# Patient Record
Sex: Male | Born: 1985 | Hispanic: No | Marital: Single | Smoking: Current some day smoker
Health system: Southern US, Community
[De-identification: ages and names within clinical notes are randomized; demographics above are authoritative.]

## PROBLEM LIST (undated history)

## (undated) DIAGNOSIS — F79 Unspecified intellectual disabilities: Secondary | ICD-10-CM

## (undated) DIAGNOSIS — K4090 Unilateral inguinal hernia, without obstruction or gangrene, not specified as recurrent: Secondary | ICD-10-CM

## (undated) SURGERY — REPAIR, HERNIA, INGUINAL, ADULT
Anesthesia: General | Laterality: Left

---

## 2009-01-01 ENCOUNTER — Emergency Department (HOSPITAL_COMMUNITY): Admission: EM | Admit: 2009-01-01 | Discharge: 2009-01-01 | Payer: Self-pay | Admitting: Emergency Medicine

## 2012-03-22 ENCOUNTER — Emergency Department (HOSPITAL_COMMUNITY)
Admission: EM | Admit: 2012-03-22 | Discharge: 2012-03-22 | Disposition: A | Discharge status: Home / Self Care | Payer: Medicaid Other | Attending: Emergency Medicine | Admitting: Emergency Medicine

## 2012-03-22 ENCOUNTER — Encounter (HOSPITAL_COMMUNITY): Payer: Self-pay | Admitting: *Deleted

## 2012-03-22 DIAGNOSIS — R079 Chest pain, unspecified: Secondary | ICD-10-CM | POA: Insufficient documentation

## 2012-03-22 DIAGNOSIS — R000 Tachycardia, unspecified: Secondary | ICD-10-CM | POA: Insufficient documentation

## 2012-03-22 DIAGNOSIS — R0609 Other forms of dyspnea: Secondary | ICD-10-CM | POA: Insufficient documentation

## 2012-03-22 DIAGNOSIS — F17200 Nicotine dependence, unspecified, uncomplicated: Secondary | ICD-10-CM | POA: Insufficient documentation

## 2012-03-22 DIAGNOSIS — R0989 Other specified symptoms and signs involving the circulatory and respiratory systems: Secondary | ICD-10-CM | POA: Insufficient documentation

## 2012-03-22 DIAGNOSIS — R110 Nausea: Secondary | ICD-10-CM | POA: Insufficient documentation

## 2012-03-22 DIAGNOSIS — M549 Dorsalgia, unspecified: Secondary | ICD-10-CM | POA: Insufficient documentation

## 2012-03-22 DIAGNOSIS — R109 Unspecified abdominal pain: Secondary | ICD-10-CM | POA: Insufficient documentation

## 2012-03-22 LAB — CBC
HCT: 48.1 % (ref 39.0–52.0)
Hemoglobin: 16.9 g/dL (ref 13.0–17.0)
MCH: 31.2 pg (ref 26.0–34.0)
MCHC: 35.1 g/dL (ref 30.0–36.0)

## 2012-03-22 LAB — COMPREHENSIVE METABOLIC PANEL
Albumin: 5.5 g/dL — ABNORMAL HIGH (ref 3.5–5.2)
BUN: 15 mg/dL (ref 6–23)
Chloride: 100 mEq/L (ref 96–112)
Creatinine, Ser: 1.12 mg/dL (ref 0.50–1.35)
GFR calc Af Amer: >90 mL/min (ref >90)
Glucose, Bld: 89 mg/dL (ref 70–99)
Total Bilirubin: 0.8 mg/dL (ref 0.3–1.2)
Total Protein: 8.5 g/dL — ABNORMAL HIGH (ref 6.0–8.3)

## 2012-03-22 LAB — DIFFERENTIAL
Basophils Relative: 0 % (ref 0–1)
Eosinophils Absolute: 0 10*3/uL (ref 0.0–0.7)
Monocytes Absolute: 0.8 10*3/uL (ref 0.1–1.0)
Monocytes Relative: 8 % (ref 3–12)

## 2012-03-22 LAB — TROPONIN I: Troponin I: <0.3 ng/mL (ref <0.30)

## 2012-03-22 LAB — LIPASE, BLOOD: Lipase: 19 U/L (ref 11–59)

## 2012-03-22 MED ORDER — GI COCKTAIL ~~LOC~~
30.0000 mL | Freq: Once | ORAL | Status: AC
  Administered 2012-03-22: 30 mL via ORAL
  Filled 2012-03-22: qty 30

## 2012-03-22 MED ORDER — FAMOTIDINE IN NACL 20-0.9 MG/50ML-% IV SOLN
20.0000 mg | Freq: Once | INTRAVENOUS | Status: AC
  Administered 2012-03-22: 20 mg via INTRAVENOUS
  Filled 2012-03-22: qty 50

## 2012-03-22 MED ORDER — ONDANSETRON HCL 4 MG/2ML IJ SOLN: 4.0000 mg | Freq: Once | INTRAMUSCULAR | Status: DC

## 2012-03-22 MED ORDER — ONDANSETRON HCL 4 MG/2ML IJ SOLN
4.0000 mg | Freq: Once | INTRAMUSCULAR | Status: AC
  Administered 2012-03-22: 4 mg via INTRAVENOUS
  Filled 2012-03-22: qty 2

## 2012-03-22 MED ORDER — OMEPRAZOLE 20 MG PO CPDR: 20.0000 mg | DELAYED_RELEASE_CAPSULE | Freq: Every day | ORAL | Status: DC

## 2012-03-22 MED ORDER — SUCRALFATE 1 GM/10ML PO SUSP: 1.0000 g | Freq: Four times a day (QID) | ORAL | Status: DC | PRN

## 2012-03-22 MED ORDER — OXYCODONE-ACETAMINOPHEN 5-325 MG PO TABS
1.0000 | ORAL_TABLET | Freq: Once | ORAL | Status: AC
  Administered 2012-03-22: 1 via ORAL
  Filled 2012-03-22: qty 1

## 2012-03-22 MED ORDER — HYDROMORPHONE HCL PF 1 MG/ML IJ SOLN: 0.5000 mg | Freq: Once | INTRAMUSCULAR | Status: DC

## 2012-03-22 MED ORDER — SODIUM CHLORIDE 0.9 % IV BOLUS (SEPSIS)
1000.0000 mL | Freq: Once | INTRAVENOUS | Status: AC
  Administered 2012-03-22: 1000 mL via INTRAVENOUS

## 2012-03-22 NOTE — Discharge Instructions (Signed)

## 2012-03-22 NOTE — ED Notes (Signed)
Pt states "it started 2 days ago, it hurts in my chest and back, feels like it is from front through to the back"

## 2012-03-22 NOTE — ED Provider Notes (Signed)
History     CSN: [id number]  Arrival date & time 03/22/12  1624   First MD Initiated Contact with Patient 03/22/12 1716      Chief Complaint  Patient presents with  . Back Pain  . Chest Pain    (Consider location/radiation/quality/duration/timing/severity/associated sxs/prior treatment) HPI The patient presents with concerns of chest pain.  He notes that prior to 2 days ago he was in his usual state of health.  While working, he developed sternal discomfort, described as sharp and burning with radiation from front to the back.  He also complains of dyspnea.  She denies any fevers, chills.  He does endorse nausea, but denies diarrhea or vomiting.  No exertional pain, but there is mild pleuritic pain.  No relief with OTC medication.  Or syncope, no headache, no confusion, no lower extremity edema. The patient is a smoker. History reviewed. No pertinent past medical history.  History reviewed. No pertinent past surgical history.  No family history on file.  History  Substance Use Topics  . Smoking status: Current Everyday Smoker -- 0.5 packs/day  . Smokeless tobacco: Not on file  . Alcohol Use: No      Review of Systems  Constitutional:       Per HPI, otherwise negative  HENT:       Per HPI, otherwise negative  Eyes: Negative.   Respiratory:       Per HPI, otherwise negative  Cardiovascular:       Per HPI, otherwise negative  Gastrointestinal: Negative for vomiting.  Genitourinary: Negative.   Musculoskeletal:       Per HPI, otherwise negative  Skin: Negative.   Neurological: Negative for syncope.    Allergies  Review of patient's allergies indicates no known allergies.  Home Medications  No current outpatient prescriptions on file.  Wt 180 lb (81.647 kg)  SpO2 100%  Physical Exam  Nursing note and vitals reviewed. Constitutional: He is oriented to person, place, and time. He appears well-developed. No distress.  HENT:  Head: Normocephalic and atraumatic.   Eyes: Conjunctivae and EOM are normal.  Cardiovascular: Regular rhythm.  Tachycardia present.   Pulmonary/Chest: Effort normal. No stridor. No respiratory distress.  Abdominal: He exhibits no distension.  Musculoskeletal: He exhibits no edema.  Neurological: He is alert and oriented to person, place, and time.  Skin: Skin is warm and dry.  Psychiatric: He has a normal mood and affect.    ED Course  Procedures (including critical care time)   Labs Reviewed  CBC  DIFFERENTIAL  COMPREHENSIVE METABOLIC PANEL  LIPASE, BLOOD  TROPONIN I   No results found.   No diagnosis found.  Cardiac monitor 101 sinus tachycardia abnormal Pulse oximetry 100% repair normal   Date: 03/22/2012  Rate: 96  Rhythm: normal sinus rhythm  QRS Axis: normal  Intervals: normal  ST/T Wave abnormalities: nonspecific ST/T changes  Conduction Disutrbances:none  Narrative Interpretation:   Old EKG Reviewed: none available BORDERLINE (LIKELY AGE APPROPRIATE CHANGES)   MDM   This 26 year old male presents with new chest pain and dyspnea.  Patient denies any family history of cardiac risk factors, but is a smoker.  Given the patient's initial prescription with pleuritic pain and dyspnea, there is some suspicion of PE, although this is unlikely given his absence of hypoxia or tachypnea.  The patient's d-dimer was negative.  Remainder the patient's labs were unremarkable, and he felt better following ED interventions.  Absent syncope or lightheadedness, and with no cardiomegaly x-ray, there is  low suspicion for hypertrophic changes.  The patient's presentation is most consistent with GERD and the patient was discharged with omeprazole and Carafate to follow up with one of our associated clinics.  Return precautions were provided.      Gerhard Munch, MD 03/22/12 1942

## 2017-02-11 ENCOUNTER — Emergency Department (HOSPITAL_COMMUNITY)
Admission: EM | Admit: 2017-02-11 | Discharge: 2017-02-11 | Disposition: A | Discharge status: Home / Self Care | Payer: Medicaid Other | Attending: Emergency Medicine | Admitting: Emergency Medicine

## 2017-02-11 ENCOUNTER — Encounter (HOSPITAL_COMMUNITY): Payer: Self-pay

## 2017-02-11 DIAGNOSIS — M6282 Rhabdomyolysis: Secondary | ICD-10-CM | POA: Diagnosis not present

## 2017-02-11 DIAGNOSIS — R52 Pain, unspecified: Secondary | ICD-10-CM | POA: Diagnosis not present

## 2017-02-11 DIAGNOSIS — F17200 Nicotine dependence, unspecified, uncomplicated: Secondary | ICD-10-CM | POA: Diagnosis not present

## 2017-02-11 DIAGNOSIS — Z79899 Other long term (current) drug therapy: Secondary | ICD-10-CM | POA: Insufficient documentation

## 2017-02-11 DIAGNOSIS — R531 Weakness: Secondary | ICD-10-CM | POA: Diagnosis present

## 2017-02-11 HISTORY — DX: Unspecified intellectual disabilities: F79

## 2017-02-11 LAB — COMPREHENSIVE METABOLIC PANEL
ALK PHOS: 55 U/L (ref 38–126)
ALT: 28 U/L (ref 17–63)
AST: 74 U/L — ABNORMAL HIGH (ref 15–41)
Albumin: 4.6 g/dL (ref 3.5–5.0)
Anion gap: 17 — ABNORMAL HIGH (ref 5–15)
BUN: 20 mg/dL (ref 6–20)
CO2: 19 mmol/L — ABNORMAL LOW (ref 22–32)
Calcium: 9.9 mg/dL (ref 8.9–10.3)
Chloride: 101 mmol/L (ref 101–111)
Creatinine, Ser: 1.23 mg/dL (ref 0.61–1.24)
GFR calc non Af Amer: >60 mL/min (ref >60)
GLUCOSE: 86 mg/dL (ref 65–99)
Potassium: 4.6 mmol/L (ref 3.5–5.1)
SODIUM: 137 mmol/L (ref 135–145)
TOTAL PROTEIN: 7.3 g/dL (ref 6.5–8.1)
Total Bilirubin: 2 mg/dL — ABNORMAL HIGH (ref 0.3–1.2)

## 2017-02-11 LAB — CBC WITH DIFFERENTIAL/PLATELET
Basophils Absolute: 0 10*3/uL (ref 0.0–0.1)
Basophils Relative: 0 %
EOS ABS: 0 10*3/uL (ref 0.0–0.7)
EOS PCT: 0 %
HCT: 44.5 % (ref 39.0–52.0)
HEMOGLOBIN: 15.2 g/dL (ref 13.0–17.0)
LYMPHS ABS: 2.2 10*3/uL (ref 0.7–4.0)
Lymphocytes Relative: 14 %
MCH: 29.6 pg (ref 26.0–34.0)
MCHC: 34.2 g/dL (ref 30.0–36.0)
MCV: 86.7 fL (ref 78.0–100.0)
MONOS PCT: 8 %
Monocytes Absolute: 1.3 10*3/uL — ABNORMAL HIGH (ref 0.1–1.0)
NEUTROS PCT: 78 %
Neutro Abs: 12.1 10*3/uL — ABNORMAL HIGH (ref 1.7–7.7)
Platelets: 348 10*3/uL (ref 150–400)
RBC: 5.13 MIL/uL (ref 4.22–5.81)
RDW: 13.8 % (ref 11.5–15.5)
WBC: 15.6 10*3/uL — ABNORMAL HIGH (ref 4.0–10.5)

## 2017-02-11 LAB — CK
CK TOTAL: 2205 U/L — AB (ref 49–397)
Total CK: 2484 U/L — ABNORMAL HIGH (ref 49–397)

## 2017-02-11 LAB — URINALYSIS, ROUTINE W REFLEX MICROSCOPIC
BACTERIA UA: NONE SEEN
Bilirubin Urine: NEGATIVE
Glucose, UA: NEGATIVE mg/dL
Ketones, ur: 80 mg/dL — AB
Leukocytes, UA: NEGATIVE
Nitrite: NEGATIVE
Protein, ur: 30 mg/dL — AB
SPECIFIC GRAVITY, URINE: 1.023 (ref 1.005–1.030)
SQUAMOUS EPITHELIAL / LPF: NONE SEEN
pH: 5 (ref 5.0–8.0)

## 2017-02-11 MED ORDER — SODIUM CHLORIDE 0.9 % IV BOLUS (SEPSIS)
1000.0000 mL | Freq: Once | INTRAVENOUS | Status: AC
  Administered 2017-02-11: 1000 mL via INTRAVENOUS
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
## 2017-02-11 NOTE — Discharge Instructions (Signed)
Stay well-hydrated and have your kidney function and CK muscle enzyme rechecked early this week.  If you were given medicines take as directed.  If you are on coumadin or contraceptives realize their levels and effectiveness is altered by many different medicines.  If you have any reaction (rash, tongues swelling, other) to the medicines stop taking and see a physician.    If your blood pressure was elevated in the ER make sure you follow up for management with a primary doctor or return for chest pain, shortness of breath or stroke symptoms.  Please follow up as directed and return to the ER or see a physician for new or worsening symptoms.  Thank you. Vitals:   02/11/17 1800 02/11/17 1830 02/11/17 1900 02/11/17 1930  BP: (!) 134/93 137/85 (!) 129/94 (!) 142/80  Pulse: (!) 107 (!) 107 (!) 106 (!) 103  Resp:      Temp:      TempSrc:      SpO2: 99% 100% 99% 98%  Weight:      Height:
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
## 2017-02-11 NOTE — ED Notes (Signed)
Gave pt sandwich  

## 2017-04-18 ENCOUNTER — Encounter (HOSPITAL_COMMUNITY): Payer: Self-pay

## 2017-04-18 ENCOUNTER — Emergency Department (HOSPITAL_COMMUNITY)
Admission: EM | Admit: 2017-04-18 | Discharge: 2017-04-18 | Disposition: A | Discharge status: Home / Self Care | Payer: Medicaid Other | Attending: Emergency Medicine | Admitting: Emergency Medicine

## 2017-04-18 DIAGNOSIS — F17200 Nicotine dependence, unspecified, uncomplicated: Secondary | ICD-10-CM | POA: Diagnosis not present

## 2017-04-18 DIAGNOSIS — R1032 Left lower quadrant pain: Secondary | ICD-10-CM | POA: Diagnosis present

## 2017-04-18 DIAGNOSIS — K4091 Unilateral inguinal hernia, without obstruction or gangrene, recurrent: Secondary | ICD-10-CM | POA: Diagnosis not present

## 2017-04-18 DIAGNOSIS — Z79899 Other long term (current) drug therapy: Secondary | ICD-10-CM | POA: Diagnosis not present

## 2017-04-18 MED ORDER — IBUPROFEN 600 MG PO TABS: 600.0000 mg | ORAL_TABLET | Freq: Four times a day (QID) | ORAL | 0 refills | Status: DC | PRN

## 2017-04-18 MED ORDER — MORPHINE SULFATE (PF) 2 MG/ML IV SOLN
4.0000 mg | Freq: Once | INTRAVENOUS | Status: AC
  Administered 2017-04-18: 4 mg via INTRAVENOUS
  Filled 2017-04-18: qty 2

## 2017-04-18 NOTE — ED Triage Notes (Signed)
Pt states bulge in groin.   Inguinal groove.  Pt states started x 2 days ago.  Denies lifting anything.  Feels it is a "hernia".

## 2017-04-18 NOTE — ED Provider Notes (Signed)
[facility]-EMERGENCY DEPT Provider Note   CSN: [id number] Arrival date & time: 04/18/17  1407     History   Chief Complaint Chief Complaint  Patient presents with  . Groin Pain    HPI Jack Mathews is a 31 y.o. male.  HPI  [age] y.o. male with a hx of Mental Disability, presents to the Emergency Department today due to groin pain x 2 days ago. Noted in left inguinal groove. Denies lifting anything. Pt states that it feels like a hernia. Denies N/V. No discoloration. Notes pain 6/10. Throbbing sensation. Does state that the bulge increases when standing and decreases with lying flat. No abdominal surgery history. No meds PTA. No fevers. No CP/SOB. Normal BMs. No other symptoms noted.   Past Medical History:  Diagnosis Date  . 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   History reviewed. No pertinent surgical history.     Home Medications    Prior to Admission medications   Medication Sig Start Date End Date Taking? Authorizing Provider  ibuprofen (ADVIL,MOTRIN) 200 MG tablet Take 200-400 mg by mouth every 6 (six) hours as needed (for pain or headaches).    [provider]  loratadine (CLARITIN) 10 MG tablet Take 10 mg by mouth daily.    [provider]  omeprazole (PRILOSEC) 20 MG capsule Take 1 capsule (20 mg total) by mouth daily. Patient not taking: Reported on 02/11/2017 03/22/12 02/11/17  Gerhard MunchLockwood, Robert, MD  sucralfate (CARAFATE) 1 GM/10ML suspension Take 10 mLs (1 g total) by mouth every 6 (six) hours as needed (abdominal pain). Patient not taking: Reported on 02/11/2017 03/22/12 02/11/17  Gerhard MunchLockwood, Robert, MD    Family History History reviewed. No pertinent family history.  Social History Social History  Substance Use Topics  . Smoking status: Current Every Day Smoker    Packs/day: 0.50  . Smokeless tobacco: Never Used  . Alcohol use No     Allergies   Patient has no  known allergies.   Review of Systems Review of Systems ROS reviewed and all are negative for acute change except as noted in the HPI.  Physical Exam Updated Vital Signs BP 127/81 (BP Location: Left Arm)   Pulse 88   Temp 98.4 F (36.9 C) (Oral)   Resp 18   SpO2 98%   Physical Exam  Constitutional: He is oriented to person, place, and time. Vital signs are normal. He appears well-developed and well-nourished.  HENT:  Head: Normocephalic and atraumatic.  Right Ear: Hearing normal.  Left Ear: Hearing normal.  Eyes: Conjunctivae and EOM are normal. Pupils are equal, round, and reactive to light.  Neck: Normal range of motion. Neck supple.  Cardiovascular: Normal rate, regular rhythm, normal heart sounds and intact distal pulses.   Pulmonary/Chest: Effort normal and breath sounds normal.  Abdominal: Soft. A hernia is present.  Left sided hernia noted along inguinal canal. No discoloration. Reducible.   Musculoskeletal: Normal range of motion.  Neurological: He is alert and oriented to person, place, and time.  Skin: Skin is warm and dry.  Psychiatric: He has a normal mood and affect. His speech is normal and behavior is normal. Thought content normal.  Nursing note and vitals reviewed.  ED Treatments / Results  Labs (all labs ordered are listed, but only abnormal results are displayed) Labs Reviewed - No data to display  EKG  EKG Interpretation None  Radiology No results found.  Procedures Hernia reduction Date/Time: 04/18/2017 4:32 PM Performed by: Audry Pili Authorized by: Audry Pili  Consent: Verbal consent obtained. Consent given by: patient Patient understanding: patient states understanding of the procedure being performed Patient identity confirmed: verbally with patient and arm band Patient tolerance: Patient tolerated the procedure well with no immediate complications Comments: Successful reduction of direct inguinal hernia     (including  critical care time)  Medications Ordered in ED Medications - No data to display   Initial Impression / Assessment and Plan / ED Course  I have reviewed the triage vital signs and the nursing notes.  Pertinent labs & imaging results that were available during my care of the patient were reviewed by me and considered in my medical decision making (see chart for details).  Final Clinical Impressions(s) / ED Diagnoses     {I have reviewed the relevant previous healthcare records.  {I obtained HPI from historian. {Patient discussed with supervising physician.  ED Course:  Assessment: Pt is a 31 y.o. male with hx Mental Disability who presents with left sided inguinal pain x 2 days. No N/V. No discoloration. No abdominal surgeries. On exam, pt in NAD. Nontoxic/nonseptic appearing. VSS. Afebrile. Lungs CTA. Heart RRR. Abdomen nontender soft. Noted reducible hernia on left inguina canal. Reduced in ED. Pain free after reduction. Given analgesia in ED. Plan is to DC Home with follow up to General Surgery. At time of discharge, Patient is in no acute distress. Vital Signs are stable. Patient is able to ambulate. Patient able to tolerate PO.   Disposition/Plan:  DC Home Additional Verbal discharge instructions given and discussed with patient.  Pt Instructed to f/u with General Surgery in the next week for evaluation and treatment of symptoms. Return precautions given Pt acknowledges and agrees with plan  Supervising Physician Tegeler, Canary Brim, *  Final diagnoses:  Unilateral recurrent inguinal hernia without obstruction or gangrene    New Prescriptions New Prescriptions   No medications on file     Audry Pili, PA-C 04/18/17 1700    Tegeler, Canary Brim, MD 04/19/17 [id number]
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
## 2017-08-07 ENCOUNTER — Ambulatory Visit: Payer: Self-pay | Admitting: Surgery

## 2017-08-07 DIAGNOSIS — K4090 Unilateral inguinal hernia, without obstruction or gangrene, not specified as recurrent: Secondary | ICD-10-CM

## 2017-08-08 NOTE — Patient Instructions (Addendum)
Jack Mathews  08/09/2017   Your procedure is scheduled on: 08-13-17   Report [facility] Main  Entrance [place]  elevators to 3rd floor to  Short Stay Center at 5:30 AM.   Call this number if you have problems the morning of surgery [phone]    Remember: ONLY 1 PERSON MAY GO WITH YOU TO SHORT STAY TO GET  READY MORNING OF YOUR SURGERY.  Do not eat food or drink liquids :After Midnight.     Take these medicines the morning of surgery with A SIP OF WATER: None                                You may not have any metal on your body including hair pins and              piercings  Do not wear jewelry, lotions, powders,  deodorant             Men may shave face and neck.   Do not bring valuables to the hospital. [facility] IS NOT             RESPONSIBLE   FOR VALUABLES.  Contacts, dentures or bridgework may not be worn into surgery. .    Patients discharged the day of surgery will not be allowed to drive home.  Name and phone number of your driver: Jack Mathews [phone]                    Please read over the following fact sheets you were given: _____________________________________________________________________             [facility] - Preparing for [facility] Before surgery, you can play an important role.  Because skin is not sterile, your skin needs to be as free of germs as possible.  You can reduce the number of germs on your skin by washing with CHG (chlorahexidine gluconate) soap before surgery.  CHG is an antiseptic cleaner which kills germs and bonds with the skin to continue killing germs even after washing. Please DO NOT use if you have an allergy to CHG or antibacterial soaps.  If your skin becomes reddened/irritated stop using the CHG and inform your Jack Mathews when you arrive at Short Stay. Do not shave (including legs and underarms) for at least 48 hours prior to the first CHG shower.  You may shave your face/neck. Please follow these  instructions carefully:  1.  Shower with CHG Soap the night before surgery and the  morning of Surgery.  2.  If you choose to wash your hair, wash your hair first as usual with your  normal  shampoo.  3.  After you shampoo, rinse your hair and body thoroughly to remove the  shampoo.                           4.  Use CHG as you would any other liquid soap.  You can apply chg directly  to the skin and wash                       Gently with a scrungie or clean washcloth.  5.  Apply the CHG Soap to your body ONLY FROM THE NECK DOWN.   Do not use  on face/ open                           Wound or open sores. Avoid contact with eyes, ears mouth and genitals (private parts).                       Wash face,  Genitals (private parts) with your normal soap.             6.  Wash thoroughly, paying special attention to the area where your surgery  will be performed.  7.  Thoroughly rinse your body with warm water from the neck down.  8.  DO NOT shower/wash with your normal soap after using and rinsing off  the CHG Soap.                9.  Pat yourself dry with a clean towel.            10.  Wear clean pajamas.            11.  Place clean sheets on your bed the night of your first shower and do not  sleep with pets. Day of Surgery : Do not apply any lotions/deodorants the morning of surgery.  Please wear clean clothes to the hospital/surgery center.  FAILURE TO FOLLOW THESE INSTRUCTIONS MAY RESULT IN THE CANCELLATION OF YOUR SURGERY PATIENT SIGNATURE_________________________________  Jack Mathews SIGNATURE__________________________________  ________________________________________________________________________

## 2017-08-09 ENCOUNTER — Encounter (HOSPITAL_COMMUNITY)
Admission: RE | Admit: 2017-08-09 | Discharge: 2017-08-09 | Disposition: A | Discharge status: Home / Self Care | Payer: Medicaid Other | Source: Ambulatory Visit | Attending: Surgery | Admitting: Surgery

## 2017-08-09 ENCOUNTER — Encounter (INDEPENDENT_AMBULATORY_CARE_PROVIDER_SITE_OTHER): Payer: Self-pay

## 2017-08-09 ENCOUNTER — Encounter (HOSPITAL_COMMUNITY): Payer: Self-pay

## 2017-08-09 DIAGNOSIS — Z01810 Encounter for preprocedural cardiovascular examination: Secondary | ICD-10-CM | POA: Insufficient documentation

## 2017-08-09 DIAGNOSIS — K4090 Unilateral inguinal hernia, without obstruction or gangrene, not specified as recurrent: Secondary | ICD-10-CM | POA: Insufficient documentation

## 2017-08-09 DIAGNOSIS — Z01818 Encounter for other preprocedural examination: Secondary | ICD-10-CM | POA: Insufficient documentation

## 2017-08-09 LAB — URINALYSIS, ROUTINE W REFLEX MICROSCOPIC
BACTERIA UA: NONE SEEN
Bilirubin Urine: NEGATIVE
GLUCOSE, UA: NEGATIVE mg/dL
Ketones, ur: 80 mg/dL — AB
LEUKOCYTES UA: NEGATIVE
NITRITE: NEGATIVE
PH: 5 (ref 5.0–8.0)
Protein, ur: NEGATIVE mg/dL
Specific Gravity, Urine: 1.021 (ref 1.005–1.030)

## 2017-08-09 LAB — COMPREHENSIVE METABOLIC PANEL
ALT: 15 U/L — ABNORMAL LOW (ref 17–63)
ANION GAP: 12 (ref 5–15)
AST: 20 U/L (ref 15–41)
Albumin: 5.2 g/dL — ABNORMAL HIGH (ref 3.5–5.0)
Alkaline Phosphatase: 56 U/L (ref 38–126)
BUN: 11 mg/dL (ref 6–20)
CHLORIDE: 103 mmol/L (ref 101–111)
CO2: 25 mmol/L (ref 22–32)
Calcium: 10.2 mg/dL (ref 8.9–10.3)
Creatinine, Ser: 1.06 mg/dL (ref 0.61–1.24)
Glucose, Bld: 71 mg/dL (ref 65–99)
Potassium: 3.7 mmol/L (ref 3.5–5.1)
SODIUM: 140 mmol/L (ref 135–145)
Total Bilirubin: 1.2 mg/dL (ref 0.3–1.2)
Total Protein: 8.2 g/dL — ABNORMAL HIGH (ref 6.5–8.1)

## 2017-08-09 LAB — CBC WITH DIFFERENTIAL/PLATELET
Basophils Absolute: 0 10*3/uL (ref 0.0–0.1)
Basophils Relative: 0 %
EOS ABS: 0.1 10*3/uL (ref 0.0–0.7)
EOS PCT: 1 %
HCT: 44.8 % (ref 39.0–52.0)
Hemoglobin: 15.8 g/dL (ref 13.0–17.0)
LYMPHS ABS: 1.5 10*3/uL (ref 0.7–4.0)
LYMPHS PCT: 15 %
MCH: 30.7 pg (ref 26.0–34.0)
MCHC: 35.3 g/dL (ref 30.0–36.0)
MCV: 87 fL (ref 78.0–100.0)
Monocytes Absolute: 0.6 10*3/uL (ref 0.1–1.0)
Monocytes Relative: 6 %
NEUTROS ABS: 8.1 10*3/uL — AB (ref 1.7–7.7)
NEUTROS PCT: 78 %
PLATELETS: 313 10*3/uL (ref 150–400)
RBC: 5.15 MIL/uL (ref 4.22–5.81)
RDW: 12.8 % (ref 11.5–15.5)
WBC: 10.3 10*3/uL (ref 4.0–10.5)

## 2017-08-09 NOTE — Progress Notes (Signed)
08-09-17 UA results routed to Dr. Cliffton AstersWhite for review.

## 2017-08-13 ENCOUNTER — Ambulatory Visit (HOSPITAL_COMMUNITY)
Admission: RE | Admit: 2017-08-13 | Discharge: 2017-08-13 | Disposition: A | Discharge status: Home / Self Care | Payer: Medicaid Other | Source: Ambulatory Visit | Attending: Surgery | Admitting: Surgery

## 2017-08-13 ENCOUNTER — Encounter (HOSPITAL_COMMUNITY)
Admission: RE | Disposition: A | Discharge status: Home / Self Care | Payer: Self-pay | Source: Ambulatory Visit | Attending: Surgery

## 2017-08-13 ENCOUNTER — Encounter (HOSPITAL_COMMUNITY): Payer: Self-pay | Admitting: *Deleted

## 2017-08-13 ENCOUNTER — Ambulatory Visit (HOSPITAL_COMMUNITY): Payer: Medicaid Other | Admitting: Certified Registered"

## 2017-08-13 DIAGNOSIS — K4090 Unilateral inguinal hernia, without obstruction or gangrene, not specified as recurrent: Secondary | ICD-10-CM | POA: Insufficient documentation

## 2017-08-13 DIAGNOSIS — F17210 Nicotine dependence, cigarettes, uncomplicated: Secondary | ICD-10-CM | POA: Diagnosis not present

## 2017-08-13 DIAGNOSIS — Z9102 Food additives allergy status: Secondary | ICD-10-CM | POA: Diagnosis not present

## 2017-08-13 DIAGNOSIS — F78 Other intellectual disabilities: Secondary | ICD-10-CM | POA: Insufficient documentation

## 2017-08-13 HISTORY — PX: INSERTION OF MESH: SHX5868

## 2017-08-13 HISTORY — PX: INGUINAL HERNIA REPAIR: SHX194

## 2017-08-13 SURGERY — REPAIR, HERNIA, INGUINAL, ADULT
Anesthesia: General | Laterality: Left

## 2017-08-13 MED ORDER — EPHEDRINE SULFATE-NACL 50-0.9 MG/10ML-% IV SOSY
PREFILLED_SYRINGE | INTRAVENOUS | Status: DC | PRN
  Administered 2017-08-13 (×3): 5 mg via INTRAVENOUS

## 2017-08-13 MED ORDER — ONDANSETRON HCL 4 MG/2ML IJ SOLN
INTRAMUSCULAR | Status: DC | PRN
  Administered 2017-08-13: 4 mg via INTRAVENOUS

## 2017-08-13 MED ORDER — ONDANSETRON HCL 4 MG/2ML IJ SOLN
INTRAMUSCULAR | Status: AC
  Filled 2017-08-13: qty 2

## 2017-08-13 MED ORDER — SUGAMMADEX SODIUM 200 MG/2ML IV SOLN
INTRAVENOUS | Status: AC
  Filled 2017-08-13: qty 2

## 2017-08-13 MED ORDER — CHLORHEXIDINE GLUCONATE CLOTH 2 % EX PADS: 6.0000 | MEDICATED_PAD | Freq: Once | CUTANEOUS | Status: DC

## 2017-08-13 MED ORDER — CEFAZOLIN SODIUM-DEXTROSE 2-4 GM/100ML-% IV SOLN
2.0000 g | INTRAVENOUS | Status: AC
  Administered 2017-08-13: 2 g via INTRAVENOUS

## 2017-08-13 MED ORDER — LIDOCAINE HCL 1 % IJ SOLN
INTRAMUSCULAR | Status: DC | PRN
  Administered 2017-08-13: 10 mL

## 2017-08-13 MED ORDER — CEFAZOLIN SODIUM-DEXTROSE 2-4 GM/100ML-% IV SOLN
INTRAVENOUS | Status: AC
  Filled 2017-08-13: qty 100

## 2017-08-13 MED ORDER — LIDOCAINE HCL (PF) 1 % IJ SOLN
INTRAMUSCULAR | Status: AC
  Filled 2017-08-13: qty 30

## 2017-08-13 MED ORDER — LACTATED RINGERS IV SOLN
INTRAVENOUS | Status: DC | PRN
  Administered 2017-08-13 (×3): via INTRAVENOUS

## 2017-08-13 MED ORDER — LIDOCAINE 2% (20 MG/ML) 5 ML SYRINGE
INTRAMUSCULAR | Status: DC | PRN
  Administered 2017-08-13: 60 mg via INTRAVENOUS
  Administered 2017-08-13: 40 mg via INTRAVENOUS

## 2017-08-13 MED ORDER — PHENYLEPHRINE 40 MCG/ML (10ML) SYRINGE FOR IV PUSH (FOR BLOOD PRESSURE SUPPORT)
PREFILLED_SYRINGE | INTRAVENOUS | Status: AC
  Filled 2017-08-13: qty 10

## 2017-08-13 MED ORDER — FENTANYL CITRATE (PF) 250 MCG/5ML IJ SOLN
INTRAMUSCULAR | Status: DC | PRN
  Administered 2017-08-13 (×3): 50 ug via INTRAVENOUS
  Administered 2017-08-13: 100 ug via INTRAVENOUS
  Administered 2017-08-13: 50 ug via INTRAVENOUS

## 2017-08-13 MED ORDER — PROPOFOL 10 MG/ML IV BOLUS
INTRAVENOUS | Status: AC
  Filled 2017-08-13: qty 20

## 2017-08-13 MED ORDER — SUCCINYLCHOLINE CHLORIDE 200 MG/10ML IV SOSY
PREFILLED_SYRINGE | INTRAVENOUS | Status: DC | PRN
  Administered 2017-08-13: 120 mg via INTRAVENOUS

## 2017-08-13 MED ORDER — SUGAMMADEX SODIUM 200 MG/2ML IV SOLN
INTRAVENOUS | Status: DC | PRN
Start: 2017-08-13 — End: 2017-08-13
  Administered 2017-08-13: 150 mg via INTRAVENOUS

## 2017-08-13 MED ORDER — ROCURONIUM BROMIDE 50 MG/5ML IV SOSY
PREFILLED_SYRINGE | INTRAVENOUS | Status: AC
  Filled 2017-08-13: qty 5

## 2017-08-13 MED ORDER — SUCCINYLCHOLINE CHLORIDE 200 MG/10ML IV SOSY
PREFILLED_SYRINGE | INTRAVENOUS | Status: AC
  Filled 2017-08-13: qty 10

## 2017-08-13 MED ORDER — HYDROMORPHONE HCL-NACL 0.5-0.9 MG/ML-% IV SOSY: 0.2500 mg | PREFILLED_SYRINGE | INTRAVENOUS | Status: DC | PRN

## 2017-08-13 MED ORDER — ROCURONIUM BROMIDE 10 MG/ML (PF) SYRINGE
PREFILLED_SYRINGE | INTRAVENOUS | Status: DC | PRN
  Administered 2017-08-13: 5 mg via INTRAVENOUS
  Administered 2017-08-13: 10 mg via INTRAVENOUS
  Administered 2017-08-13: 25 mg via INTRAVENOUS

## 2017-08-13 MED ORDER — BUPIVACAINE-EPINEPHRINE 0.5% -1:200000 IJ SOLN
INTRAMUSCULAR | Status: DC | PRN
  Administered 2017-08-13: 10 mL

## 2017-08-13 MED ORDER — HYDROCODONE-ACETAMINOPHEN 5-325 MG PO TABS: 1.0000 | ORAL_TABLET | Freq: Four times a day (QID) | ORAL | 0 refills | Status: AC | PRN

## 2017-08-13 MED ORDER — FENTANYL CITRATE (PF) 250 MCG/5ML IJ SOLN
INTRAMUSCULAR | Status: AC
  Filled 2017-08-13: qty 5

## 2017-08-13 MED ORDER — FENTANYL CITRATE (PF) 100 MCG/2ML IJ SOLN
INTRAMUSCULAR | Status: AC
  Filled 2017-08-13: qty 2

## 2017-08-13 MED ORDER — PROPOFOL 10 MG/ML IV BOLUS
INTRAVENOUS | Status: DC | PRN
  Administered 2017-08-13: 180 mg via INTRAVENOUS

## 2017-08-13 MED ORDER — EPHEDRINE 5 MG/ML INJ
INTRAVENOUS | Status: AC
  Filled 2017-08-13: qty 10

## 2017-08-13 MED ORDER — MIDAZOLAM HCL 2 MG/2ML IJ SOLN
INTRAMUSCULAR | Status: DC | PRN
  Administered 2017-08-13: 1 mg via INTRAVENOUS

## 2017-08-13 MED ORDER — HYDROCODONE-ACETAMINOPHEN 7.5-325 MG PO TABS
1.0000 | ORAL_TABLET | Freq: Four times a day (QID) | ORAL | Status: DC | PRN
  Administered 2017-08-13: 1 via ORAL
  Filled 2017-08-13: qty 1

## 2017-08-13 MED ORDER — BUPIVACAINE-EPINEPHRINE (PF) 0.5% -1:200000 IJ SOLN
INTRAMUSCULAR | Status: AC
  Filled 2017-08-13: qty 30

## 2017-08-13 MED ORDER — MIDAZOLAM HCL 2 MG/2ML IJ SOLN
INTRAMUSCULAR | Status: AC
  Filled 2017-08-13: qty 2

## 2017-08-13 MED ORDER — FENTANYL CITRATE (PF) 100 MCG/2ML IJ SOLN: 25.0000 ug | INTRAMUSCULAR | Status: DC | PRN

## 2017-08-13 MED ORDER — LIDOCAINE 2% (20 MG/ML) 5 ML SYRINGE
INTRAMUSCULAR | Status: AC
  Filled 2017-08-13: qty 5

## 2017-08-13 MED ORDER — DEXAMETHASONE SODIUM PHOSPHATE 10 MG/ML IJ SOLN
INTRAMUSCULAR | Status: DC | PRN
  Administered 2017-08-13: 10 mg via INTRAVENOUS

## 2017-08-13 MED ORDER — DEXAMETHASONE SODIUM PHOSPHATE 10 MG/ML IJ SOLN
INTRAMUSCULAR | Status: AC
  Filled 2017-08-13: qty 1

## 2017-08-13 MED ORDER — 0.9 % SODIUM CHLORIDE (POUR BTL) OPTIME
TOPICAL | Status: DC | PRN
  Administered 2017-08-13: 1000 mL

## 2017-08-13 MED ORDER — HYDROCODONE-ACETAMINOPHEN 5-325 MG PO TABS: 1.0000 | ORAL_TABLET | Freq: Four times a day (QID) | ORAL | 0 refills | Status: DC | PRN

## 2017-08-13 SURGICAL SUPPLY — 44 items
BENZOIN TINCTURE PRP APPL 2/3 (GAUZE/BANDAGES/DRESSINGS) IMPLANT
BLADE HEX COATED 2.75 (ELECTRODE) ×3 IMPLANT
BLADE SURG 15 STRL LF DISP TIS (BLADE) IMPLANT
BLADE SURG 15 STRL SS (BLADE)
CHLORAPREP W/TINT 26ML (MISCELLANEOUS) ×3 IMPLANT
CLOSURE WOUND 1/2 X4 (GAUZE/BANDAGES/DRESSINGS) ×1
DECANTER SPIKE VIAL GLASS SM (MISCELLANEOUS) ×3 IMPLANT
DERMABOND ADVANCED (GAUZE/BANDAGES/DRESSINGS) ×2
DERMABOND ADVANCED .7 DNX12 (GAUZE/BANDAGES/DRESSINGS) ×1 IMPLANT
DRAIN PENROSE 18X1/2 LTX STRL (DRAIN) ×3 IMPLANT
DRAPE INCISE IOBAN 66X45 STRL (DRAPES) IMPLANT
DRAPE LAPAROTOMY TRNSV 102X78 (DRAPE) ×3 IMPLANT
DRAPE UTILITY XL STRL (DRAPES) IMPLANT
DRSG TEGADERM 2-3/8X2-3/4 SM (GAUZE/BANDAGES/DRESSINGS) IMPLANT
DRSG TEGADERM 4X4.75 (GAUZE/BANDAGES/DRESSINGS) IMPLANT
DRSG TELFA 3X8 NADH (GAUZE/BANDAGES/DRESSINGS) IMPLANT
ELECT PENCIL ROCKER SW 15FT (MISCELLANEOUS) IMPLANT
ELECT REM PT RETURN 15FT ADLT (MISCELLANEOUS) ×3 IMPLANT
GAUZE SPONGE 4X4 12PLY STRL (GAUZE/BANDAGES/DRESSINGS) IMPLANT
GLOVE ECLIPSE 8.0 STRL XLNG CF (GLOVE) ×3 IMPLANT
GLOVE INDICATOR 8.0 STRL GRN (GLOVE) ×6 IMPLANT
GOWN STRL REUS W/TWL LRG LVL3 (GOWN DISPOSABLE) ×3 IMPLANT
GOWN STRL REUS W/TWL XL LVL3 (GOWN DISPOSABLE) ×3 IMPLANT
KIT BASIN OR (CUSTOM PROCEDURE TRAY) ×3 IMPLANT
MESH ULTRAPRO 3X6 7.6X15CM (Mesh General) ×3 IMPLANT
NEEDLE HYPO 25X1 1.5 SAFETY (NEEDLE) ×3 IMPLANT
NS IRRIG 1000ML POUR BTL (IV SOLUTION) IMPLANT
PACK GENERAL/GYN (CUSTOM PROCEDURE TRAY) ×3 IMPLANT
PUMP ON Q 100MLX2ML/HR (PAIN MANAGEMENT) IMPLANT
SPONGE LAP 4X18 X RAY DECT (DISPOSABLE) IMPLANT
STRIP CLOSURE SKIN 1/2X4 (GAUZE/BANDAGES/DRESSINGS) ×2 IMPLANT
SUT ETHIBOND 0 MO6 C/R (SUTURE) ×6 IMPLANT
SUT MNCRL AB 4-0 PS2 18 (SUTURE) ×3 IMPLANT
SUT PROLENE 2 0 CT2 30 (SUTURE) IMPLANT
SUT VIC AB 2-0 SH 18 (SUTURE) IMPLANT
SUT VIC AB 2-0 SH 27 (SUTURE) ×2
SUT VIC AB 2-0 SH 27X BRD (SUTURE) ×1 IMPLANT
SUT VIC AB 3-0 SH 27 (SUTURE)
SUT VIC AB 3-0 SH 27XBRD (SUTURE) IMPLANT
SYR BULB IRRIGATION 50ML (SYRINGE) IMPLANT
SYR CONTROL 10ML LL (SYRINGE) ×3 IMPLANT
TOWEL OR 17X26 10 PK STRL BLUE (TOWEL DISPOSABLE) ×3 IMPLANT
TOWEL OR NON WOVEN STRL DISP B (DISPOSABLE) ×3 IMPLANT
YANKAUER SUCT BULB TIP 10FT TU (MISCELLANEOUS) IMPLANT

## 2017-08-13 NOTE — Anesthesia Preprocedure Evaluation (Addendum)
Anesthesia Evaluation  Patient identified by MRN, date of birth, ID band Patient awake    Reviewed: Allergy & Precautions, NPO status , Patient's Chart, lab work & pertinent test results  Airway Mallampati: II  TM Distance: >3 FB     Dental  (+) Dental Advisory Given   Pulmonary Current Smoker,    breath sounds clear to auscultation       Cardiovascular negative cardio ROS   Rhythm:Regular Rate:Normal     Neuro/Psych    GI/Hepatic negative GI ROS, Neg liver ROS,   Endo/Other  negative endocrine ROS  Renal/GU negative Renal ROS     Musculoskeletal   Abdominal   Peds  Hematology   Anesthesia Other Findings   Reproductive/Obstetrics                            Anesthesia Physical Anesthesia Plan  ASA: II  Anesthesia Plan: General   Post-op Pain Management:    Induction:   PONV Risk Score and Plan: 1 and Ondansetron, Dexamethasone and Treatment may vary due to age or medical condition  Airway Management Planned:   Additional Equipment:   Intra-op Plan:   Post-operative Plan: Extubation in OR  Informed Consent: I have reviewed the patients History and Physical, chart, labs and discussed the procedure including the risks, benefits and alternatives for the proposed anesthesia with the patient or authorized representative who has indicated his/her understanding and acceptance.   Dental advisory given  Plan Discussed with: CRNA and Anesthesiologist  Anesthesia Plan Comments:         Anesthesia Quick Evaluation

## 2017-08-13 NOTE — Discharge Instructions (Signed)
ABDOMINAL SURGERY: POST OP INSTRUCTIONS  1. DIET: Follow a light bland diet the first 24 hours after arrival home, such as soup, liquids, crackers, etc.  Be sure to include lots of fluids daily.  Avoid fast food or heavy meals as your are more likely to get nauseated.  Eat a low fat the next few days after surgery.   2. Take your usually prescribed home medications unless otherwise directed. 3. PAIN CONTROL: a. Pain is best controlled by a usual combination of three different methods TOGETHER: i. Ice/Heat ii. Over the counter pain medication iii. Prescription pain medication b. Most patients will experience some swelling and bruising around the incisions.  Ice packs or heating pads (30-60 minutes up to 6 times a day) will help. Use ice for the first few days to help decrease swelling and bruising, then switch to heat to help relax tight/sore spots and speed recovery.  Some people prefer to use ice alone, heat alone, alternating between ice & heat.  Experiment to what works for you.  Swelling and bruising can take several weeks to resolve.   c. It is helpful to take an over-the-counter pain medication regularly for the first few weeks.  Choose one of the following that works best for you: i. Naproxen (Aleve, etc)  Two  tabs twice a day ii. Ibuprofen (Advil, etc) Three  tabs four times a day (every meal & bedtime) iii. Acetaminophen (Tylenol, etc) 500-650mg  four times a day (every meal & bedtime) d. A  prescription for pain medication (such as oxycodone, hydrocodone, etc) should be given to you upon discharge.  Take your pain medication as prescribed.  4. Avoid getting constipated.  Between the surgery and the pain medications, it is common to experience some constipation.  Increasing fluid intake and taking a fiber supplement (such as Metamucil, Citrucel, FiberCon, MiraLax, etc) 1-2 times a day regularly will usually help prevent this problem from occurring.  A mild laxative (prune juice,  Milk of Magnesia, MiraLax, etc) should be taken according to package directions if there are no bowel movements after 48 hours.   5. Wash / shower every day.  You may shower over the incision / wound.  Avoid baths until the skin is fully healed.  Continue to shower over incision(s) after the dressing is off. 6. Your incision is covered with dermabond which is like superglue. This is waterproof and you can bathe normally 7. ACTIVITIES as tolerated:   a. You may resume regular (light) daily activities beginning the next day--such as daily self-care, walking, climbing stairs--gradually increasing activities as tolerated.  If you can walk 30 minutes without difficulty, it is safe to try more intense activity such as jogging, treadmill, bicycling, low-impact aerobics, swimming, etc. b. Save the most intensive and strenuous activity for last such as sit-ups, heavy lifting, contact sports, etc  Refrain from any heavy lifting or straining until you are off narcotics for pain control.   c. DO NOT PUSH THROUGH PAIN.  Let pain be your guide: If it hurts to do something, don't do it.  Pain is your body warning you to avoid that activity for another week until the pain goes down. d. You may drive when you are no longer taking prescription pain medication, you can comfortably wear a seatbelt, and you can safely maneuver your car and apply brakes. e. Bonita Quin may have sexual intercourse when it is comfortable.  8. FOLLOW UP in our office a. Please call [facility] at [phone] to set up  an appointment to see your surgeon in the office for a follow-up appointment approximately 1-2 weeks after your surgery. b. Make sure that you call for this appointment the day you arrive home to insure a convenient appointment time. 10. IF YOU HAVE DISABILITY OR FAMILY LEAVE FORMS, BRING THEM TO THE OFFICE FOR PROCESSING.  DO NOT GIVE THEM TO YOUR DOCTOR.   WHEN TO CALL us [phone]: 1. Poor pain control 2. Reactions / problems with  new medications (rash/itching, nausea, etc)  3. Fever over 101.5 F (38.5 C) 4. Inability to urinate 5. Nausea and/or vomiting 6. Worsening swelling or bruising 7. Continued bleeding from incision. 8. Increased pain, redness, or drainage from the incision  The clinic staff is available to answer your questions during regular business hours (8:30am-5pm).  Please dont hesitate to call and ask to speak to one of our nurses for clinical concerns.   A surgeon from [facility] is always on call at the hospitals   If you have a medical emergency, go to the nearest emergency room or call 911.    [facility] [address], [address], [place], [place]  [address] ? MAIN: [phone] ? TOLL FREE: [phone] ? FAX [phone] [email or web address]

## 2017-08-13 NOTE — Anesthesia Postprocedure Evaluation (Signed)
Anesthesia Post Note  Patient: Jack Mathews  Procedure(s) Performed: Procedure(s) (LRB): OPEN LEFT INGUINAL HERNIA REPAIR (Left) INSERTION OF MESH (Left)     Patient location during evaluation: PACU Anesthesia Type: General Level of consciousness: awake Pain management: pain level controlled Vital Signs Assessment: post-procedure vital signs reviewed and stable Respiratory status: spontaneous breathing Cardiovascular status: stable Anesthetic complications: no    Last Vitals:  Vitals:   08/13/17 1000 08/13/17 1107  BP: (!) 137/91 (!) 152/94  Pulse: 88 91  Resp: 14 18  Temp: 36.8 C 36.7 C  SpO2: 100% 100%    Last Pain:  Vitals:   08/13/17 1107  TempSrc:   PainSc: 5                  Jaasia Viglione

## 2017-08-13 NOTE — Transfer of Care (Signed)
Immediate Anesthesia Transfer of Care Note  Patient: Jack Mathews  Procedure(s) Performed: Procedure(s): OPEN LEFT INGUINAL HERNIA REPAIR (Left) INSERTION OF MESH (Left)  Patient Location: PACU  Anesthesia Type:General  Level of Consciousness: awake, sedated and patient cooperative  Airway & Oxygen Therapy: Patient Spontanous Breathing and Patient connected to face mask oxygen  Post-op Assessment: Report given to RN and Post -op Vital signs reviewed and stable  Post vital signs: Reviewed and stable  Last Vitals:  Vitals:   08/13/17 0535  BP: (!) 138/100  Pulse: 73  Resp: 16  Temp: 36.9 C  SpO2: 100%    Last Pain:  Vitals:   08/13/17 0553  TempSrc:   PainSc: 7       Patients Stated Pain Goal: 5 (08/13/17 0553)  Complications: No apparent anesthesia complications

## 2017-08-13 NOTE — Anesthesia Procedure Notes (Signed)
Procedure Name: Intubation Date/Time: 08/13/2017 7:38 AM Performed by: Minerva Ends Pre-anesthesia Checklist: Patient identified, Emergency Drugs available, Suction available and Patient being monitored Patient Re-evaluated:Patient Re-evaluated prior to induction Oxygen Delivery Method: Circle System Utilized Preoxygenation: Pre-oxygenation with 100% oxygen Induction Type: IV induction Ventilation: Mask ventilation without difficulty Laryngoscope Size: Miller and 2 Grade View: Grade I Tube type: Oral Number of attempts: 1 Airway Equipment and Method: Stylet Placement Confirmation: ETT inserted through vocal cords under direct vision,  positive ETCO2 and breath sounds checked- equal and bilateral Secured at: 22 cm Tube secured with: Tape Dental Injury: Teeth and Oropharynx as per pre-operative assessment  Comments: Smooth IV induction Green--- intubation AM CRNA atraumatic--- teeth and mouth as preop-- bilat BS Green

## 2017-08-13 NOTE — Op Note (Addendum)
Hernia, Open, Procedure Note  Indications: Jack Mathews is an 31 y.o. male with cognitive delay who has had left inguinal pain and bulge. First noticed this in May 2018, never prior. Pain is throbbing/crampy and worse with activity. Hernia has been reducible. Was seen in the ER 04/18/17 and it was reducible then as well. No prior groin operations or procedures.  Pre-operative Diagnosis: left reducible inguinal hernia  Post-operative Diagnosis: same  Surgeon: Andria Meuse   Assistants: Ovidio Kin, MD  Anesthesia: General endotracheal anesthesia  ASA Class: 1  Procedure Details  The patient was seen again in the Holding Room. The risks, benefits, complications, treatment options, and expected outcomes were discussed with the patient. The possibilities of reaction to medication, pulmonary aspiration, perforation of viscus, bleeding, recurrent infection, the need for additional procedures, and development of a complication requiring transfusion or further operation were discussed with the patient and/or family; the possibility of recurrence of the hernia was also discussed. The likelihood of success in repairing the hernia and returning the patient to their previous functional status is good.  There was concurrence with the proposed plan, and informed consent was obtained. The site of surgery was properly noted/marked. The patient was taken to the Operating Room, identified as Jack Mathews, and the procedure verified as left inguinal hernia repair. A Time Out was held and the above information confirmed.  The patient was placed in the supine position and underwent induction of anesthesia. The lower abdomen and groin was prepped with Chloraprep and draped in the standard fashion, 0.5% Marcaine with epinephrine mixed 50:50 with 0.25% lidocaine was used to anesthetize the skin over the mid-portion of the inguinal canal. An oblique incision was made. Dissection was carried down through the  subcutaneous tissue with cautery, through Scarpa's and into the external oblique fascia.  We opened the external oblique fascia along the direction of its fibers to the external ring.  The spermatic cord was circumferentially dissected bluntly and retracted with a Penrose drain.  The ilioinguinal nerve was identified and preserved as this was remote from where the mesh would lay in the inguinal floor.  The floor of the inguinal canal was inspected.  The spermatic cord was skeletonized and an indirect hernia was identified. The hernia sac was dissected free from the cord, taking care to preserve all structures of the cord including the spermatic cord vessels and the vas deferens. The sac was opened superiorly and cephalad and the contents had been reduced into the peritoneal cavity. A high ligation of the sac was performed using a 2-0 Vicryl suture at the level of the internal ring; care was taken to ensure no peritoneal contents were incorporated in our sac closure suture which was a purse-string. A spermatic cord lipoma was identified and removed, the base suture ligated with 3-0 vicryl suture. A piece of light-weight prolene mesh was cut to fit the inguinal floor and tails developed. The medial portion of the mesh was secured to the pubic tubercle with two 0 Ethibond sutures, ensuring at least 2cm of medial coverage over the tubercle and also ensuring the bites included some periosteum of the tubercle. The mesh was secured inferiorly to the shelving edge of the inguinal ligament and cephalad to the conjoined tendon using interrupted 0 Ethibond sutures. The mesh tails were then tucked underneath the external oblique fascia laterally. The tails of the mesh were then closed around the spermatic cord to recreate the internal inguinal ring - care was made to ensure this was  loose enough to accommodate the tip of the finger. Again, the ileoinguinal nerve was distant from where our mesh laid in the groin. The inguinal  floor was irrigated with sterile saline. Hemostasis was then verified. The external oblique fascia was reapproximated with 2-0 Vicryl.  3-0 Vicryl was used to close the subcutaneous tissues and 4-0 Monocryl was used to close the skin in subcuticular fashion.  The incision was then covered with Dermabond.  The patient was then extubated and brought to the recovery room in stable condition.  All sponge, instrument, and needle counts were correct prior to closure and at the conclusion of the case.   Estimated Blood Loss: less than 50 mL                 Complications: None; patient tolerated the procedure well.         Disposition: PACU - hemodynamically stable.         Condition: stable

## 2017-08-13 NOTE — H&P (Signed)
  CC: Left groin pain - here for surgery  HPI: Jack Mathews is an 31 y.o. male with cognitive delay who has had left inguinal pain and bulge. First noticed this in May 2018, never prior. Pain is throbbing/crampy and worse with activity. Hernia has been reducible. Was seen in the ER 04/18/17 and it was reducible then as well. No prior groin operations or procedures. Lives at his brother's house although his mother has been his caretaker for all things medical. Denies tobacco use, EtOH nor illicit drug use.  Past Medical History:  Diagnosis Date  . Mental disability    functions at the age of an 31  year old    History reviewed. No pertinent surgical history.  History reviewed. No pertinent family history.  Social:  reports that he has been smoking.  He has been smoking about 0.25 packs per day. He has never used smokeless tobacco. He reports that he does not drink alcohol or use drugs.  Allergies:  Allergies  Allergen Reactions  . Sugar-Protein-Starch Other (See Comments)    Triggers eczema Outbreaks    Medications: I have reviewed the patient's current medications.  No results found for this or any previous visit (from the past 48 hour(s)).  No results found.  Review of Systems - General ROS: negative for - chills or fever Psychological ROS: negative for - anxiety or depression Hematological and Lymphatic ROS: negative for - bleeding problems or blood clots Endocrine ROS: negative for - skin changes Respiratory ROS: no cough, shortness of breath, or wheezing Cardiovascular ROS: no chest pain or dyspnea on exertion Gastrointestinal ROS: as per HPI; no changes in bowel habits. Denies n/v/diarrhea Genito-Urinary ROS: no dysuria, trouble voiding, or hematuria Musculoskeletal ROS: negative for - joint pain Dermatological ROS: negative for skin lesion changes  PE Blood pressure (!) 138/100, pulse 73, temperature 98.5 F (36.9 C), temperature source Oral, resp. rate 16, height 5'  10" (1.778 m), weight 78.7 kg (173 lb 8 oz), SpO2 100 %. Gen: NAD, comfortable CV: RRR Pulm: Normal work of breathing GI: soft, NT/ND; LEFT groin bulge, reducible, no scrotal component Ext: No pitting edema No results found for this or any previous visit (from the past 48 hour(s)).   A/P: Reducible left inguinal hernia  -Plan left inguinal hernia repair with mesh, open -The procedure, material risks (including but not limited to chronic pain, bleeding, infection, scarring, need for additional procedures, recurrence, ischemic orchitis, damage to surrounding structures/bowel/blood vessels/vas, heart attack, stroke), benefits and alternatives were discussed with the patient and his mother. Both of their questions were answered to their satisfaction and they elected to proceed with surgery.  Stephanie Coup. Cliffton Asters, M.D. [facility]

## 2019-09-05 ENCOUNTER — Other Ambulatory Visit: Payer: Self-pay

## 2019-09-05 ENCOUNTER — Emergency Department (HOSPITAL_COMMUNITY): Payer: Medicaid Other

## 2019-09-05 ENCOUNTER — Emergency Department (HOSPITAL_COMMUNITY)
Admission: EM | Admit: 2019-09-05 | Discharge: 2019-09-05 | Disposition: A | Discharge status: Home / Self Care | Payer: Medicaid Other | Attending: Emergency Medicine | Admitting: Emergency Medicine

## 2019-09-05 ENCOUNTER — Encounter (HOSPITAL_COMMUNITY): Payer: Self-pay | Admitting: *Deleted

## 2019-09-05 DIAGNOSIS — F17200 Nicotine dependence, unspecified, uncomplicated: Secondary | ICD-10-CM | POA: Insufficient documentation

## 2019-09-05 DIAGNOSIS — R9389 Abnormal findings on diagnostic imaging of other specified body structures: Secondary | ICD-10-CM

## 2019-09-05 DIAGNOSIS — R1031 Right lower quadrant pain: Secondary | ICD-10-CM

## 2019-09-05 DIAGNOSIS — Z79899 Other long term (current) drug therapy: Secondary | ICD-10-CM | POA: Diagnosis not present

## 2019-09-05 DIAGNOSIS — K4090 Unilateral inguinal hernia, without obstruction or gangrene, not specified as recurrent: Secondary | ICD-10-CM | POA: Diagnosis present

## 2019-09-05 HISTORY — DX: Unilateral inguinal hernia, without obstruction or gangrene, not specified as recurrent: K40.90

## 2019-09-05 LAB — COMPREHENSIVE METABOLIC PANEL
ALT: 25 U/L (ref 0–44)
AST: 22 U/L (ref 15–41)
Albumin: 4.5 g/dL (ref 3.5–5.0)
Alkaline Phosphatase: 60 U/L (ref 38–126)
Anion gap: 15 (ref 5–15)
BUN: 10 mg/dL (ref 6–20)
CO2: 24 mmol/L (ref 22–32)
Calcium: 10.2 mg/dL (ref 8.9–10.3)
Chloride: 98 mmol/L (ref 98–111)
Creatinine, Ser: 1.13 mg/dL (ref 0.61–1.24)
GFR calc Af Amer: >60 mL/min (ref >60)
GFR calc non Af Amer: >60 mL/min (ref >60)
Glucose, Bld: 87 mg/dL (ref 70–99)
Potassium: 3.7 mmol/L (ref 3.5–5.1)
Sodium: 137 mmol/L (ref 135–145)
Total Bilirubin: 0.9 mg/dL (ref 0.3–1.2)
Total Protein: 7.8 g/dL (ref 6.5–8.1)

## 2019-09-05 LAB — CBC
HCT: 47.6 % (ref 39.0–52.0)
Hemoglobin: 15.7 g/dL (ref 13.0–17.0)
MCH: 30.4 pg (ref 26.0–34.0)
MCHC: 33 g/dL (ref 30.0–36.0)
MCV: 92.1 fL (ref 80.0–100.0)
Platelets: 304 10*3/uL (ref 150–400)
RBC: 5.17 MIL/uL (ref 4.22–5.81)
RDW: 12.8 % (ref 11.5–15.5)
WBC: 13.7 10*3/uL — ABNORMAL HIGH (ref 4.0–10.5)
nRBC: 0 % (ref 0.0–0.2)

## 2019-09-05 LAB — LACTIC ACID, PLASMA: Lactic Acid, Venous: 1.5 mmol/L (ref 0.5–1.9)

## 2019-09-05 MED ORDER — ONDANSETRON HCL 4 MG/2ML IJ SOLN
4.0000 mg | Freq: Once | INTRAMUSCULAR | Status: AC
  Administered 2019-09-05: 4 mg via INTRAVENOUS
  Filled 2019-09-05: qty 2

## 2019-09-05 MED ORDER — IOHEXOL 300 MG/ML  SOLN
100.0000 mL | Freq: Once | INTRAMUSCULAR | Status: AC | PRN
  Administered 2019-09-05: 100 mL via INTRAVENOUS

## 2019-09-05 MED ORDER — MORPHINE SULFATE (PF) 4 MG/ML IV SOLN
4.0000 mg | Freq: Once | INTRAVENOUS | Status: AC
  Administered 2019-09-05: 4 mg via INTRAVENOUS
  Filled 2019-09-05: qty 1

## 2019-09-05 MED ORDER — OXYCODONE HCL 5 MG PO TABS: 5.0000 mg | ORAL_TABLET | ORAL | 0 refills | Status: AC | PRN

## 2019-09-05 NOTE — ED Notes (Signed)
Please call pt's mother Constance Holster [phone] update her on what is going to be happening. Is he going to have surgery, etc, etc

## 2019-09-05 NOTE — ED Provider Notes (Addendum)
[facility] Provider Note   CSN: [id number] Arrival date & time: 09/05/19  1216     History   Chief Complaint Chief Complaint  Patient presents with  . Inguinal Hernia    HPI Jack Mathews is a 33 y.o. male hypertension and history of left-sided hernia presents for right-sided inguinal pain.  Patient states he has 10 out of 10 sharp, cramping right inguinal pain that is worse with movement and sitting that acutely worsened yesterday. Paint states discomfort began 3 to 4 weeks ago when he noticed a lump in his groin.  Last bowel movement was approximately 1 hour ago nonbloody and no pain with defecation.  Patient denies any prior surgeries other than prior hernia surgery.   Patient states he has been eating and drinking normally, denies heavy lifting; patient took some ibuprofen which did not help with his pain.   Denies any fevers, chills, chest pain, nausea, vomiting, changes in his bowel movements or urinary symptoms.      HPI  Past Medical History:  Diagnosis Date  . Hernia, inguinal   . Mental disability    functions at the age of an 33  year old    There are no active problems to display for this patient.   Past Surgical History:  Procedure Laterality Date  . INGUINAL HERNIA REPAIR Left 08/13/2017   Procedure: OPEN LEFT INGUINAL HERNIA REPAIR;  Surgeon: Andria MeuseWhite, Christopher M, MD;  Location: [facility];  Service: General;  Laterality: Left;  . INSERTION OF MESH Left 08/13/2017   Procedure: INSERTION OF MESH;  Surgeon: Andria MeuseWhite, Christopher M, MD;  Location: [facility] OR[facility];  Service: General;  Laterality: Left;        Home Medications    Prior to Admission medications   Medication Sig Start Date End Date Taking? Authorizing Provider  ibuprofen (ADVIL,MOTRIN) 600 MG tablet Take 1 tablet (600 mg total) by mouth every 6 (six) hours as needed. Patient not taking: Reported on 08/09/2017 04/18/17   Audry PiliMohr, Tyler, PA-C  omeprazole (PRILOSEC) 20 MG  capsule Take 1 capsule (20 mg total) by mouth daily. Patient not taking: Reported on 02/11/2017 03/22/12 02/11/17  Gerhard MunchLockwood, Robert, MD  oxyCODONE (ROXICODONE) 5 MG immediate release tablet Take 1 tablet (5 mg total) by mouth every 4 (four) hours as needed for up to 5 days for severe pain. 09/05/19 09/10/19  Gailen ShelterFondaw, Angelette Ganus S, PA  sucralfate (CARAFATE) 1 GM/10ML suspension Take 10 mLs (1 g total) by mouth every 6 (six) hours as needed (abdominal pain). Patient not taking: Reported on 02/11/2017 03/22/12 02/11/17  Gerhard MunchLockwood, Robert, MD    Family History No family history on file.  Social History Social History   Tobacco Use  . Smoking status: Current Some Day Smoker    Packs/day: 0.25  . Smokeless tobacco: Never Used  Substance Use Topics  . Alcohol use: No  . Drug use: No     Allergies   Sugar-protein-starch   Review of Systems Review of Systems  Constitutional: Negative for fever.  HENT: Negative for congestion.   Eyes: Negative for pain.  Respiratory: Negative for cough.   Gastrointestinal: Negative for blood in stool.  Genitourinary: Negative for discharge, flank pain, penile pain and scrotal swelling.       Groin pain and lump  Musculoskeletal: Negative for back pain.  Neurological: Negative for headaches.     Physical Exam Updated Vital Signs BP (!) 124/93   Pulse 80   Temp 99.1 F (37.3 C) (Oral)  Resp 16   Ht  (1.778 m)   Wt 80.7 kg   SpO2 97%   BMI 25.54 kg/m   Physical Exam Vitals signs and nursing note reviewed.  Constitutional:      General: He is in acute distress.  HENT:     Head: Normocephalic and atraumatic.     Nose: Nose normal.     Mouth/Throat:     Mouth: Mucous membranes are moist.  Eyes:     General: No scleral icterus. Neck:     Musculoskeletal: Normal range of motion.  Cardiovascular:     Rate and Rhythm: Normal rate and regular rhythm.     Pulses: Normal pulses.     Heart sounds: Normal heart sounds.  Pulmonary:     Effort:  Pulmonary effort is normal. No respiratory distress.     Breath sounds: No wheezing.  Abdominal:     Palpations: Abdomen is soft.     Tenderness: There is abdominal tenderness (RL quadrant tenderness). There is no right CVA tenderness or left CVA tenderness.     Hernia: Hernia: Unable to reduce manually.     Comments: Right inguinal mass approximately 3 cm in diameter.  Exquisitely tender to palpation.  Bowel sounds not auscultated over area.  Musculoskeletal:     Right lower leg: No edema.     Left lower leg: No edema.  Skin:    General: Skin is warm and dry.     Capillary Refill: Capillary refill takes less than 2 seconds.  Neurological:     Mental Status: He is alert. Mental status is at baseline.  Psychiatric:        Mood and Affect: Mood normal.        Behavior: Behavior normal.      ED Treatments / Results  Labs (all labs ordered are listed, but only abnormal results are displayed) Labs Reviewed  CBC - Abnormal; Notable for the following components:      Result Value   WBC 13.7 (*)    All other components within normal limits  COMPREHENSIVE METABOLIC PANEL  LACTIC ACID, PLASMA  LACTIC ACID, PLASMA    EKG None  Radiology Ct Abdomen Pelvis W Contrast  Result Date: 09/05/2019 CLINICAL DATA:  Inguinal hernia. EXAM: CT ABDOMEN AND PELVIS WITH CONTRAST TECHNIQUE: Multidetector CT imaging of the abdomen and pelvis was performed using the standard protocol following bolus administration of intravenous contrast. CONTRAST:  OMNIPAQUE IOHEXOL 300 MG/ML  SOLN COMPARISON:  None. FINDINGS: Lower chest: No acute abnormality. Hepatobiliary: No focal liver abnormality is seen. No gallstones, gallbladder wall thickening, or biliary dilatation. Pancreas: Unremarkable. No pancreatic ductal dilatation or surrounding inflammatory changes. Spleen: 1.9 cm oval low-density lesion in the superior spleen, likely a benign cyst or lymphangioma. Normal in size. Adrenals/Urinary Tract: The  adrenal glands are unremarkable. 1.1 cm exophytic simple cyst arising from the left kidney. No renal calculi or hydronephrosis. The bladder is decompressed. Stomach/Bowel: Stomach is within normal limits. Appendix appears normal. No evidence of bowel wall thickening, distention, or inflammatory changes. Vascular/Lymphatic: No significant vascular findings. Multiple enlarged right external iliac and inguinal lymph nodes, with the largest inguinal lymph node measuring up to 2.4 cm in short axis. Prominent subcentimeter right common iliac lymph node measuring 8 mm in short axis. Reproductive: Prostate is unremarkable. Other: No abdominal wall hernia or abnormality. No abdominopelvic ascites. No pneumoperitoneum. Musculoskeletal: No acute or significant osseous findings. IMPRESSION: 1. Right external iliac and inguinal lymphadenopathy measuring up to 2.4  cm in short axis, concerning for lymphoproliferative disorder or metastatic disease. Tissue sampling is recommended. The dominant right inguinal lymph node is amenable to ultrasound-guided biopsy. 2. No inguinal hernia. Electronically Signed   By: Titus Dubin M.D.   On: 09/05/2019 20:24    Procedures Procedures (including critical care time)  Medications Ordered in ED Medications  morphine 4 MG/ML injection 4 mg (4 mg Intravenous Given 09/05/19 1915)  ondansetron (ZOFRAN) injection 4 mg (4 mg Intravenous Given 09/05/19 1923)  iohexol (OMNIPAQUE) 300 MG/ML solution 100 mL (100 mLs Intravenous Contrast Given 09/05/19 2003)     Initial Impression / Assessment and Plan / ED Course  I have reviewed the triage vital signs and the nursing notes.  Pertinent labs & imaging results that were available during my care of the patient were reviewed by me and considered in my medical decision making (see chart for details).        33 year old male with history of left-sided inguinal hernia with surgical repair 1 year ago presents for right-sided severely tender  mass.   The scan shows no hernia.  Present is concerning for lymphoproliferative disorder or cancerous etiology.  Discussed results with patient and recommended close follow-up with PCP to arrange interventional radiology biopsy. Also sent with referral to IR so that patient or mother may arrange for an appointment to be made.  Patient with mild WBC elevation.  In light of CT scan may be due to malignancy.  No concern for infectious etiology as patient is afebrile has taken antibiotics today.  Patient feeling better after pain medication.  Discussed with patient that this is not a hernia.  Discussed the importance of follow-up with primary care who patient states he saw today.  Patient understanding of plan to discharge and follow-up with PCP and likely need for biopsy.  Will treat pain with oxycodone however explained this is temporary fix and will need follow up and identification of mass. Patient provided me with phone number to mother to explain results. Phone call made however no response.         The patient appears reasonably screened and/or stabilized for discharge and I doubt any other medical condition or other [facility] requiring further screening, evaluation, or treatment in the ED at this time prior to discharge.  Patient is hemodynamically stable, in NAD, and able to ambulate in the ED. Pain has been managed or a plan has been made for home management and has no complaints prior to discharge. Patient is comfortable with above plan and is stable for discharge at this time. All questions were answered prior to disposition. Results from the ER workup discussed with the patient face to face and all questions answered to the best of my ability. The patient is safe for discharge with strict return precautions. Patient appears safe for discharge with appropriate follow-up.  Conveyed my impression with the patient and he voiced understanding and is agreeable to plan.   An After Visit Summary was  printed and given to the patient.  Portions of this note were generated with Lobby[employer]. Dictation errors may occur despite best attempts at proofreading.      09/11/2019 addendum: Called patient to ensure his follow up with IR/PCP for further investigation of painful mass. No answer. No message left on voice recording.    Final Clinical Impressions(s) / ED Diagnoses   Final diagnoses:  Right inguinal pain  Abnormal finding on CT scan    ED Discharge Orders  Ordered    oxyCODONE (ROXICODONE) 5 MG immediate release tablet  Every 4 hours PRN     09/05/19 2105           Gailen Shelter, [place] 09/06/19 0011    Virgina Norfolk, DO 09/06/19 2240    Gailen Shelter, [place] 09/11/19 1904    Virgina Norfolk, DO 09/12/19 4098

## 2019-09-05 NOTE — Discharge Instructions (Signed)
Follow-up as soon as possible with your primary care to arrange interventional radiology biopsy of lesion in your right groin.  Please use oxycodone as prescribed for pain control as needed.

## 2019-09-05 NOTE — ED Triage Notes (Signed)
Patient states he has a known inguinal hernia. He was sent to the ER because he needs surgery according to the diagnosing physician.

## 2019-09-05 NOTE — ED Notes (Signed)
Pt to NF stating that his pain level has increased.

## 2019-09-11 ENCOUNTER — Other Ambulatory Visit (HOSPITAL_COMMUNITY): Payer: Self-pay | Admitting: Endocrinology

## 2019-09-11 DIAGNOSIS — R590 Localized enlarged lymph nodes: Secondary | ICD-10-CM

## 2019-09-12 ENCOUNTER — Encounter (HOSPITAL_COMMUNITY): Payer: Self-pay

## 2019-09-12 NOTE — Progress Notes (Signed)
Jack Mathews Male, 33 y.o., 10/06/1986 MRN:  [id number] Phone:  [phone] (H) PCP:  Kristie Cowman, MD Coverage:  Medicaid [place]/Medicaid [place] [facility] ([facility]) 09/23/2019 at 1:00 PM  RE: Biopsy Received: Today Message Contents  Jack Daft, MD  Ernestene Mention for US guided core biopsy of right inguinal LN.   [place]   Previous Messages  ----- Message -----  From: Jack Mathews  Sent: 09/11/2019  4:38 PM EDT  To: Ir Procedure Requests  Subject: Biopsy                      Procedure Requested: US Biopsy (lymph nodes)    Reason for Procedure: right inguinal lymph node    Provider Requesting:  Beverley Fiedler, NP  Provider Telephone:   [phone]    Other Info: CT in Epic

## 2019-09-22 ENCOUNTER — Other Ambulatory Visit: Payer: Self-pay | Admitting: Radiology

## 2019-09-23 ENCOUNTER — Other Ambulatory Visit: Payer: Self-pay

## 2019-09-23 ENCOUNTER — Encounter (HOSPITAL_COMMUNITY): Payer: Self-pay

## 2019-09-23 ENCOUNTER — Ambulatory Visit (HOSPITAL_COMMUNITY)
Admission: RE | Admit: 2019-09-23 | Discharge: 2019-09-23 | Disposition: A | Discharge status: Home / Self Care | Payer: Medicaid Other | Source: Ambulatory Visit | Attending: Endocrinology | Admitting: Endocrinology

## 2019-09-23 DIAGNOSIS — R590 Localized enlarged lymph nodes: Secondary | ICD-10-CM | POA: Diagnosis not present

## 2019-09-23 DIAGNOSIS — R591 Generalized enlarged lymph nodes: Secondary | ICD-10-CM | POA: Diagnosis present

## 2019-09-23 DIAGNOSIS — F17210 Nicotine dependence, cigarettes, uncomplicated: Secondary | ICD-10-CM | POA: Diagnosis not present

## 2019-09-23 LAB — CBC
HCT: 44.5 % (ref 39.0–52.0)
Hemoglobin: 14.9 g/dL (ref 13.0–17.0)
MCH: 30.4 pg (ref 26.0–34.0)
MCHC: 33.5 g/dL (ref 30.0–36.0)
MCV: 90.8 fL (ref 80.0–100.0)
Platelets: 394 10*3/uL (ref 150–400)
RBC: 4.9 MIL/uL (ref 4.22–5.81)
RDW: 12.6 % (ref 11.5–15.5)
WBC: 10.5 10*3/uL (ref 4.0–10.5)
nRBC: 0 % (ref 0.0–0.2)

## 2019-09-23 LAB — PROTIME-INR
INR: 1 (ref 0.8–1.2)
Prothrombin Time: 13.3 seconds (ref 11.4–15.2)

## 2019-09-23 MED ORDER — SODIUM CHLORIDE 0.9 % IV SOLN: INTRAVENOUS | Status: DC

## 2019-09-23 MED ORDER — MIDAZOLAM HCL 2 MG/2ML IJ SOLN
INTRAMUSCULAR | Status: AC
  Filled 2019-09-23: qty 2

## 2019-09-23 MED ORDER — LIDOCAINE HCL (PF) 1 % IJ SOLN
INTRAMUSCULAR | Status: AC
  Filled 2019-09-23: qty 30

## 2019-09-23 MED ORDER — FENTANYL CITRATE (PF) 100 MCG/2ML IJ SOLN
INTRAMUSCULAR | Status: AC | PRN
  Administered 2019-09-23 (×2): 50 ug via INTRAVENOUS

## 2019-09-23 MED ORDER — FENTANYL CITRATE (PF) 100 MCG/2ML IJ SOLN
INTRAMUSCULAR | Status: AC
  Filled 2019-09-23: qty 2

## 2019-09-23 MED ORDER — MIDAZOLAM HCL 2 MG/2ML IJ SOLN
INTRAMUSCULAR | Status: AC | PRN
  Administered 2019-09-23 (×2): 1 mg via INTRAVENOUS

## 2019-09-23 NOTE — Progress Notes (Signed)
Pt's mother called inquiring information about the patient and his procedure.  She stated she did not get a call from the Dr.  Dr Kathlene Cote paged and will talk with pt's mother.

## 2019-09-23 NOTE — Discharge Instructions (Signed)
Needle Biopsy, Care After °These instructions tell you how to care for yourself after your procedure. Your doctor may also give you more specific instructions. Call your doctor if you have any problems or questions. °What can I expect after the procedure? °After the procedure, it is common to have: °· Soreness. °· Bruising. °· Mild pain. °Follow these instructions at home: ° °· Return to your normal activities as told by your doctor. Ask your doctor what activities are safe for you. °· Take over-the-counter and prescription medicines only as told by your doctor. °· Wash your hands with soap and water before you change your bandage (dressing). If you cannot use soap and water, use hand sanitizer. °· Follow instructions from your doctor about: °? How to take care of your puncture site. °? When and how to change your bandage. °? When to remove your bandage. °· Check your puncture site every day for signs of infection. Watch for: °? Redness, swelling, or pain. °? Fluid or blood.  °? Pus or a bad smell. °? Warmth. °· Do not take baths, swim, or use a hot tub until your doctor approves. Ask your doctor if you may take showers. You may only be allowed to take sponge baths. °· Keep all follow-up visits as told by your doctor. This is important. °Contact a doctor if you have: °· A fever. °· Redness, swelling, or pain at the puncture site, and it lasts longer than a few days. °· Fluid, blood, or pus coming from the puncture site. °· Warmth coming from the puncture site. °Get help right away if: °· You have a lot of bleeding from the puncture site. °Summary °· After the procedure, it is common to have soreness, bruising, or mild pain at the puncture site. °· Check your puncture site every day for signs of infection, such as redness, swelling, or pain. °· Get help right away if you have severe bleeding from your puncture site. °This information is not intended to replace advice given to you by your health care provider. Make  sure you discuss any questions you have with your health care provider. °Document Released: 10/26/2008 Document Revised: 11/26/2017 Document Reviewed: 11/26/2017 °Elsevier Patient Education © 2020 [employer] Inc. ° °

## 2019-09-23 NOTE — H&P (Signed)
Chief Complaint: Patient was seen in consultation today for right inguinal node biopsy at the request of Felix PaciniVanderburg,Laura Lee  Referring Physician(s): Felix PaciniVanderburg,Laura Lee  Supervising Physician: Irish LackYamagata, Glenn  [facility]  History of Present Illness: Jack Mathews is a 33 y.o. male   Pt has noted enlarging Lymph node Rt groin x over 1 mo Pain sometimes OTC meds relieve pain easily  PMD orders imaging CT 09/05/19: IMPRESSION: 1. Right external iliac and inguinal lymphadenopathy measuring up to 2.4 cm in short axis, concerning for lymphoproliferative disorder or metastatic disease. Tissue sampling is recommended. The dominant right inguinal lymph node is amenable to ultrasound-guided biopsy. 2. No inguinal hernia.  Scheduled now for biopsy of same   Past Medical History:  Diagnosis Date   Hernia, inguinal    Mental disability    functions at the age of an 33  year old    Past Surgical History:  Procedure Laterality Date   INGUINAL HERNIA REPAIR Left 08/13/2017   Procedure: OPEN LEFT INGUINAL HERNIA REPAIR;  Surgeon: Andria MeuseWhite, Christopher M, MD;  Location: [facility];  Service: General;  Laterality: Left;   INSERTION OF MESH Left 08/13/2017   Procedure: INSERTION OF MESH;  Surgeon: Andria MeuseWhite, Christopher M, MD;  Location: [facility];  Service: General;  Laterality: Left;    Allergies: Sugar-protein-starch  Medications: Prior to Admission medications   Not on File     History reviewed. No pertinent family history.  Social History   Socioeconomic History   Marital status: Single    Spouse name: Not on file   Number of children: Not on file   Years of education: Not on file   Highest education level: Not on file  Occupational History   Not on file  Social Needs   Financial resource strain: Not on file   Food insecurity    Worry: Not on file    Inability: Not on file   Transportation needs    Medical: Not on file    Non-medical: Not  on file  Tobacco Use   Smoking status: Current Some Day Smoker    Packs/day: 0.25   Smokeless tobacco: Never Used  Substance and Sexual Activity   Alcohol use: No   Drug use: No   Sexual activity: Not on file  Lifestyle   Physical activity    Days per week: Not on file    Minutes per session: Not on file   Stress: Not on file  Relationships   Social connections    Talks on phone: Not on file    Gets together: Not on file    Attends religious service: Not on file    Active member of club or organization: Not on file    Attends meetings of clubs or organizations: Not on file    Relationship status: Not on file  Other Topics Concern   Not on file  Social History Narrative   Not on file    Review of Systems: A 12 point ROS discussed and pertinent positives are indicated in the HPI above.  All other systems are negative.  Review of Systems  Constitutional: Negative for activity change, fatigue and fever.  Respiratory: Negative for cough and shortness of breath.   Cardiovascular: Negative for chest pain.  Gastrointestinal: Negative for abdominal pain.  Musculoskeletal: Negative for back pain.  Psychiatric/Behavioral: Negative for behavioral problems and confusion.    Vital Signs: BP (!) 144/103    Pulse 89    Temp 98.6 F (  37 C) (Oral)    Resp 20    Ht 5\' 10"  (1.778 m)    Wt 182 lb (82.6 kg)    SpO2 100%    BMI 26.11 kg/m   Physical Exam Vitals signs reviewed.  Cardiovascular:     Rate and Rhythm: Normal rate and regular rhythm.     Heart sounds: Normal heart sounds.  Pulmonary:     Breath sounds: Normal breath sounds.  Abdominal:     Palpations: Abdomen is soft.  Musculoskeletal: Normal range of motion.     Comments: Palpable Rt inguinal LAN  Skin:    General: Skin is warm and dry.  Neurological:     Mental Status: He is alert and oriented to person, place, and time.  Psychiatric:        Mood and Affect: Mood normal.        Behavior: Behavior normal.         Thought Content: Thought content normal.        Judgment: Judgment normal.     Imaging: Ct Abdomen Pelvis W Contrast  Result Date: 09/05/2019 CLINICAL DATA:  Inguinal hernia. EXAM: CT ABDOMEN AND PELVIS WITH CONTRAST TECHNIQUE: Multidetector CT imaging of the abdomen and pelvis was performed using the standard protocol following bolus administration of intravenous contrast. CONTRAST:  11/05/2019 OMNIPAQUE IOHEXOL 300 MG/ML  SOLN COMPARISON:  None. FINDINGS: Lower chest: No acute abnormality. Hepatobiliary: No focal liver abnormality is seen. No gallstones, gallbladder wall thickening, or biliary dilatation. Pancreas: Unremarkable. No pancreatic ductal dilatation or surrounding inflammatory changes. Spleen: 1.9 cm oval low-density lesion in the superior spleen, likely a benign cyst or lymphangioma. Normal in size. Adrenals/Urinary Tract: The adrenal glands are unremarkable. 1.1 cm exophytic simple cyst arising from the left kidney. No renal calculi or hydronephrosis. The bladder is decompressed. Stomach/Bowel: Stomach is within normal limits. Appendix appears normal. No evidence of bowel wall thickening, distention, or inflammatory changes. Vascular/Lymphatic: No significant vascular findings. Multiple enlarged right external iliac and inguinal lymph nodes, with the largest inguinal lymph node measuring up to 2.4 cm in short axis. Prominent subcentimeter right common iliac lymph node measuring 8 mm in short axis. Reproductive: Prostate is unremarkable. Other: No abdominal wall hernia or abnormality. No abdominopelvic ascites. No pneumoperitoneum. Musculoskeletal: No acute or significant osseous findings. IMPRESSION: 1. Right external iliac and inguinal lymphadenopathy measuring up to 2.4 cm in short axis, concerning for lymphoproliferative disorder or metastatic disease. Tissue sampling is recommended. The dominant right inguinal lymph node is amenable to ultrasound-guided biopsy. 2. No inguinal hernia.  Electronically Signed   By: M.D.   On: 09/05/2019 20:24    Labs:  CBC: Recent Labs    09/05/19 1840  WBC 13.7*  HGB 15.7  HCT 47.6  PLT 304    COAGS: No results for input(s): INR, APTT in the last 8760 hours.  BMP: Recent Labs    09/05/19 1840  NA 137  K 3.7  CL 98  CO2 24  GLUCOSE 87  BUN 10  CALCIUM 10.2  CREATININE 1.13  GFRNONAA >60  GFRAA >60    LIVER FUNCTION TESTS: Recent Labs    09/05/19 1840  BILITOT 0.9  AST 22  ALT 25  ALKPHOS 60  PROT 7.8  ALBUMIN 4.5    TUMOR MARKERS: No results for input(s): AFPTM, CEA, CA199, CHROMGRNA in the last 8760 hours.  Assessment and Plan:  Right inguinal LAN Enlarging x 1 mo Painful Now scheduled for biopsy Risks and benefits  of Rt inguinal lymph node biopsy was discussed with the patient and/or patient's family including, but not limited to bleeding, infection, damage to adjacent structures or low yield requiring additional tests.  All of the questions were answered and there is agreement to proceed. Consent signed and in chart.   Thank you for this interesting consult.  I greatly enjoyed meeting Jack Mathews and look forward to participating in their care.  A copy of this report was sent to the requesting provider on this date.  Electronically Signed: Lavonia Drafts, PA-C 09/23/2019, 11:58 AM   I spent a total of  30 Minutes   in face to face in clinical consultation, greater than 50% of which was counseling/coordinating care for right inguinal LN Bx

## 2019-09-23 NOTE — Procedures (Signed)
Interventional Radiology Procedure Note  Procedure: US Guided Biopsy of right inguinal lymph node  Complications: None  Estimated Blood Loss: < 10 mL  Findings: 16 G core biopsy of right inguinal lymph node performed under US guidance.  Four core samples obtained and sent to Pathology.  Kanai Hilger T. Natoria Archibald, M.D Pager:  [phone]   

## 2019-09-25 LAB — SURGICAL PATHOLOGY

## 2020-03-15 IMAGING — CT CT ABD-PELV W/ CM
2 of 4 series · 16 of 46 positions shown, 18 images · IV contrast (omnipaque)
Comparison: None.

CLINICAL DATA: Inguinal hernia.

EXAM:
CT ABDOMEN AND PELVIS WITH CONTRAST
TECHNIQUE: Multidetector CT imaging of the abdomen and pelvis was performed
using the standard protocol following bolus administration of
intravenous contrast.
CONTRAST:  100mL OMNIPAQUE IOHEXOL 300 MG/ML  SOLN

[Series 3: abd/ pelvis 5.0 i30f 2 · axial · 0.76mm/px · z∈[+836,+1251]mm · 13 of 91 slices shown, 15 images]
[im 4/91  soft-tissue]
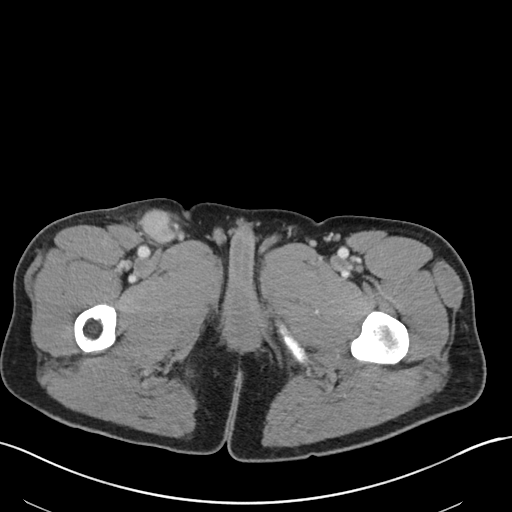
[im 4/91  bone]
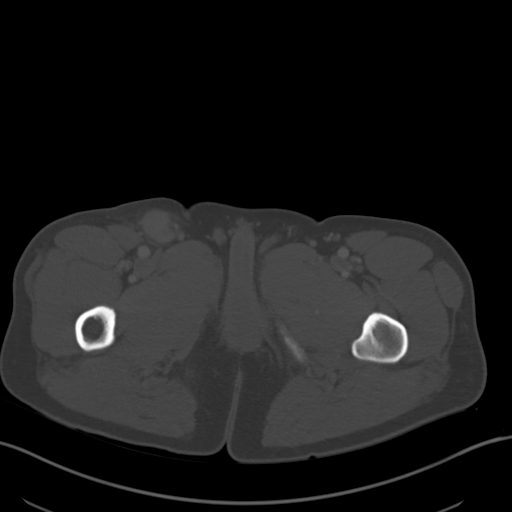
[im 11/91  soft-tissue]
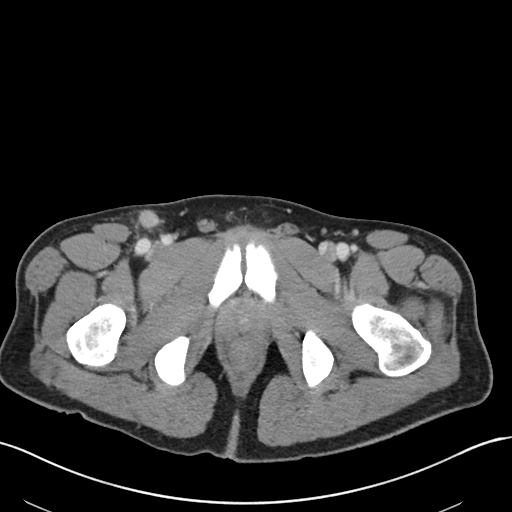
[im 19/91  soft-tissue]
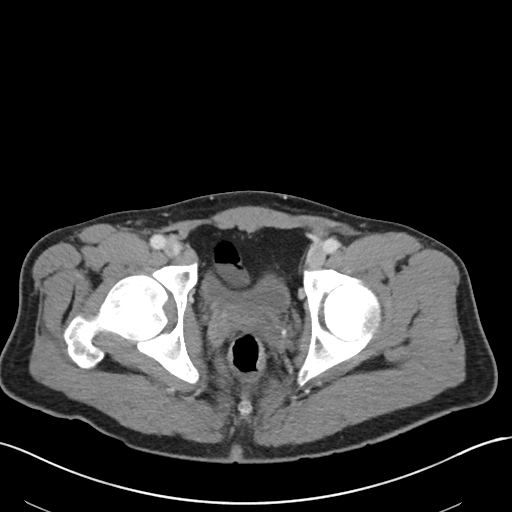
[im 26/91  soft-tissue]
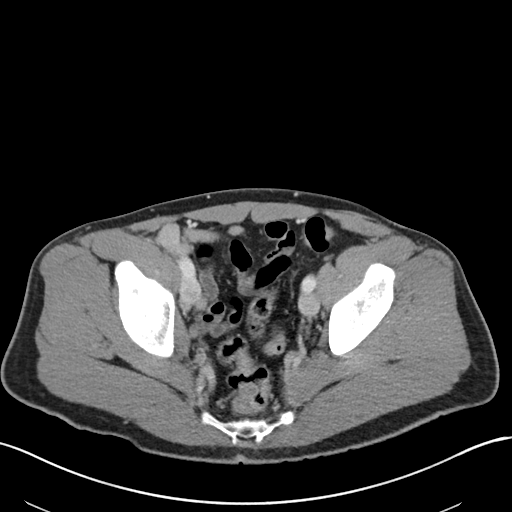
[im 33/91  soft-tissue]
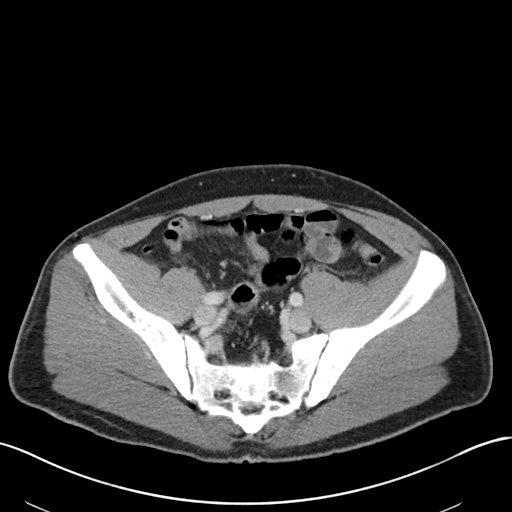
[im 40/91  soft-tissue]
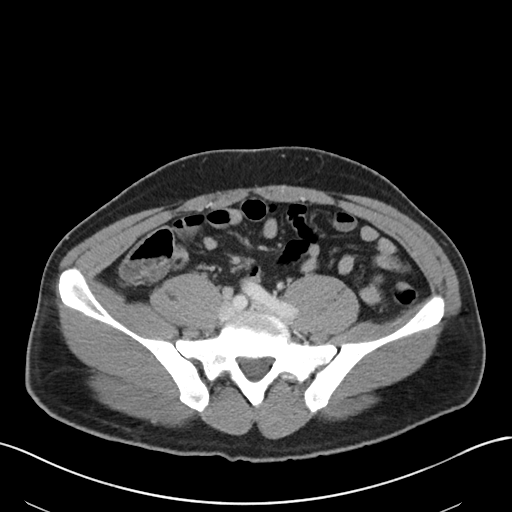
[im 47/91  soft-tissue]
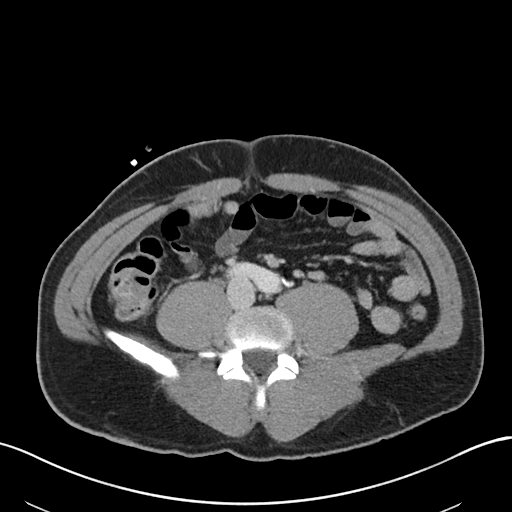
[im 51/91  soft-tissue]
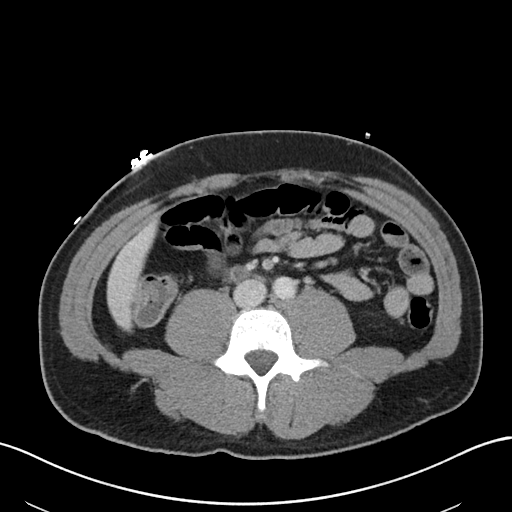
[im 58/91  soft-tissue]
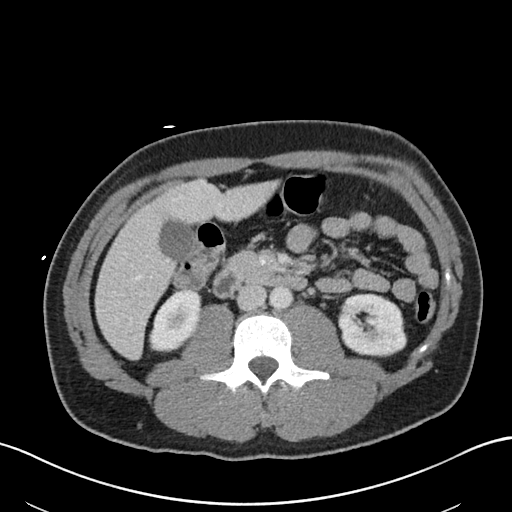
[im 58/91  bone]
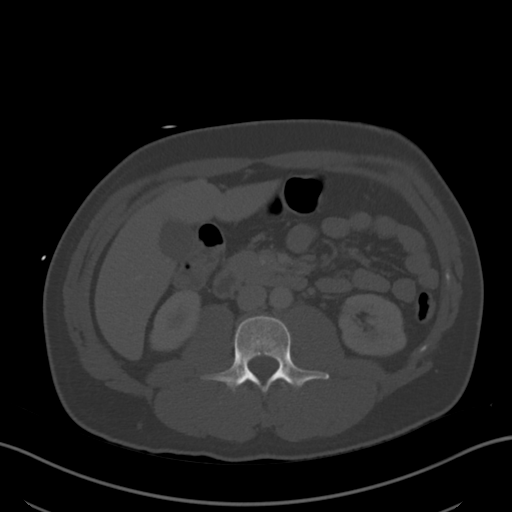
[im 65/91  soft-tissue]
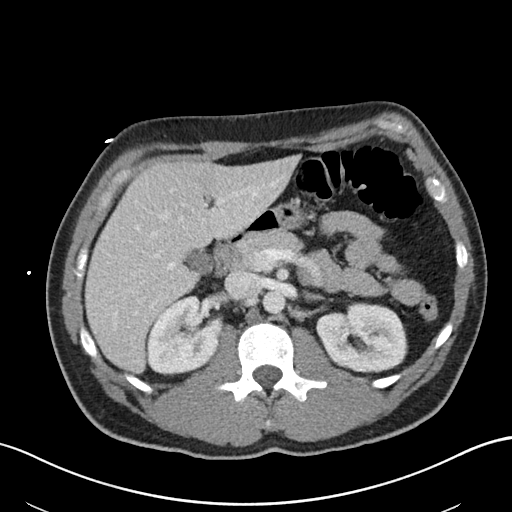
[im 73/91  soft-tissue]
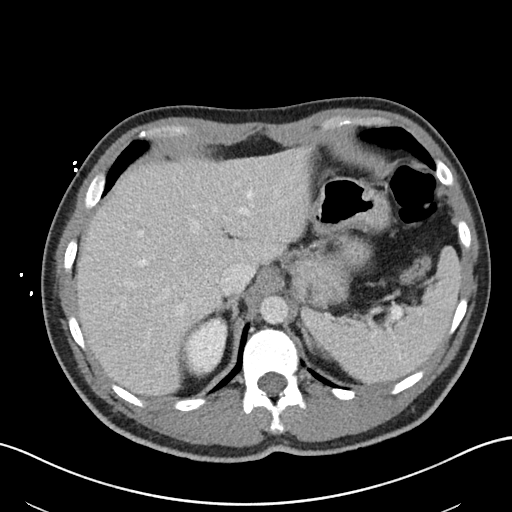
[im 80/91  soft-tissue]
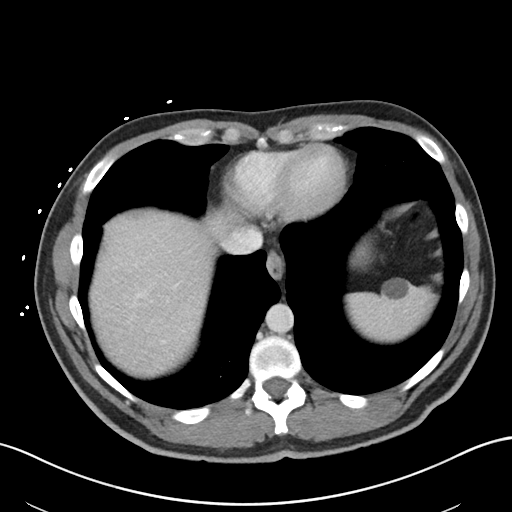
[im 87/91  soft-tissue]
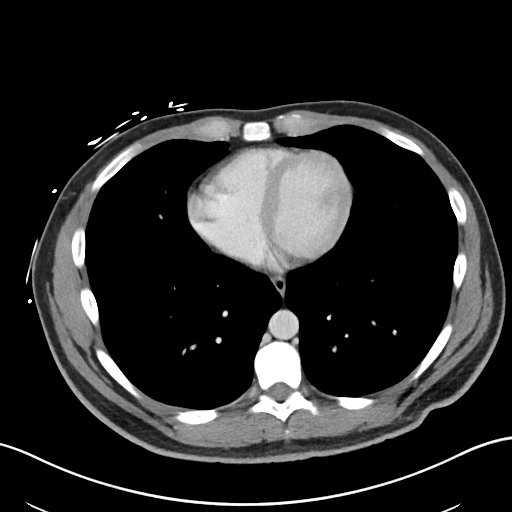

[Series 6: coronal soft tissue · coronal · 0.77mm/px · 3 of 96 slices shown]
[im 32/96  soft-tissue]
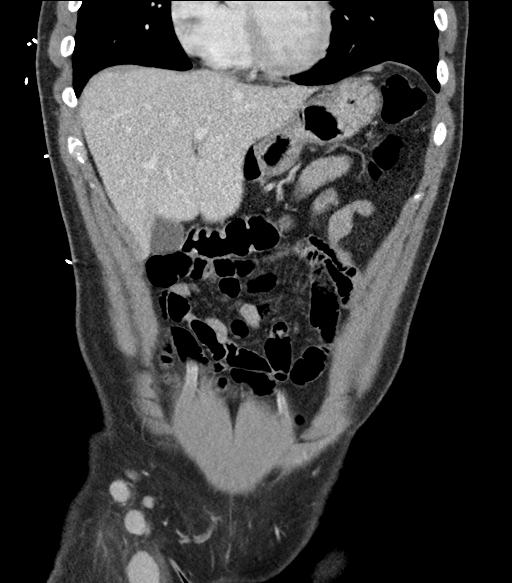
[im 43/96  soft-tissue]
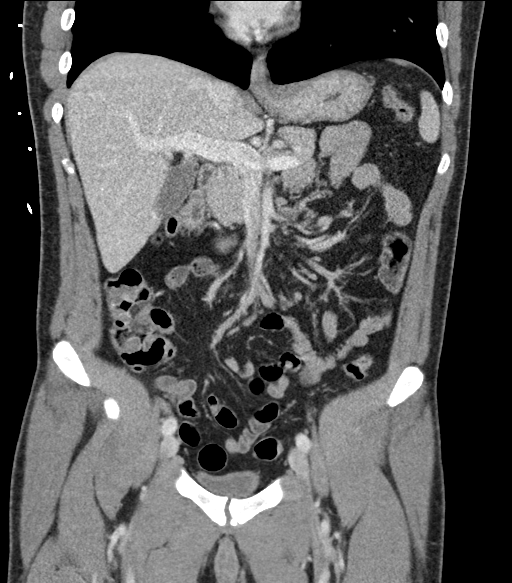
[im 53/96  soft-tissue]
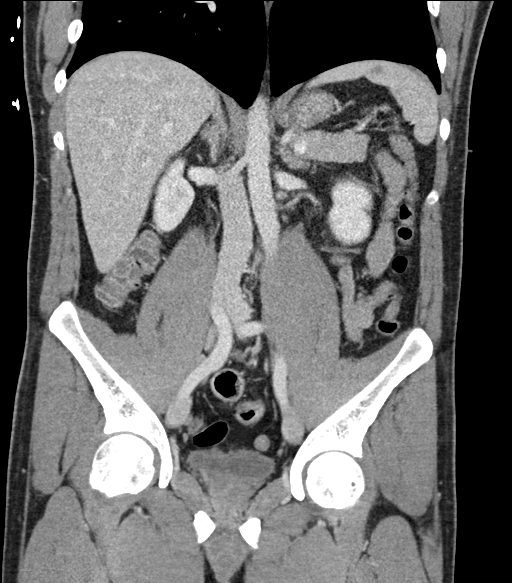

[16 of 46 positions shown; findings below may reference images not displayed]

FINDINGS: Lower chest: No acute abnormality.

Hepatobiliary: No focal liver abnormality is seen. No gallstones,
gallbladder wall thickening, or biliary dilatation.

Pancreas: Unremarkable. No pancreatic ductal dilatation or
surrounding inflammatory changes.

Spleen: 1.9 cm oval low-density lesion in the superior spleen,
likely a benign cyst or lymphangioma. Normal in size.

Adrenals/Urinary Tract: The adrenal glands are unremarkable. 1.1 cm
exophytic simple cyst arising from the left kidney. No renal calculi
or hydronephrosis. The bladder is decompressed.

Stomach/Bowel: Stomach is within normal limits. Appendix appears
normal. No evidence of bowel wall thickening, distention, or
inflammatory changes.

Vascular/Lymphatic: No significant vascular findings. Multiple
enlarged right external iliac and inguinal lymph nodes, with the
largest inguinal lymph node measuring up to 2.4 cm in short axis.
Prominent subcentimeter right common iliac lymph node measuring 8 mm
in short axis.

Reproductive: Prostate is unremarkable.

Other: No abdominal wall hernia or abnormality. No abdominopelvic
ascites. No pneumoperitoneum.

Musculoskeletal: No acute or significant osseous findings.
IMPRESSION: 1. Right external iliac and inguinal lymphadenopathy measuring up to
2.4 cm in short axis, concerning for lymphoproliferative disorder or
metastatic disease. Tissue sampling is recommended. The dominant
right inguinal lymph node is amenable to ultrasound-guided biopsy.
2. No inguinal hernia.

## 2020-04-02 IMAGING — US US BIOPSY LYMPH NODE
1 series · 13 of 16 positions shown · non-contrast
Comparison: none

INDICATION: Right inguinal and external iliac lymphadenopathy.

[Series 1: us biopsy lymph node · 13 of 16 slices shown]
[im 1/16]
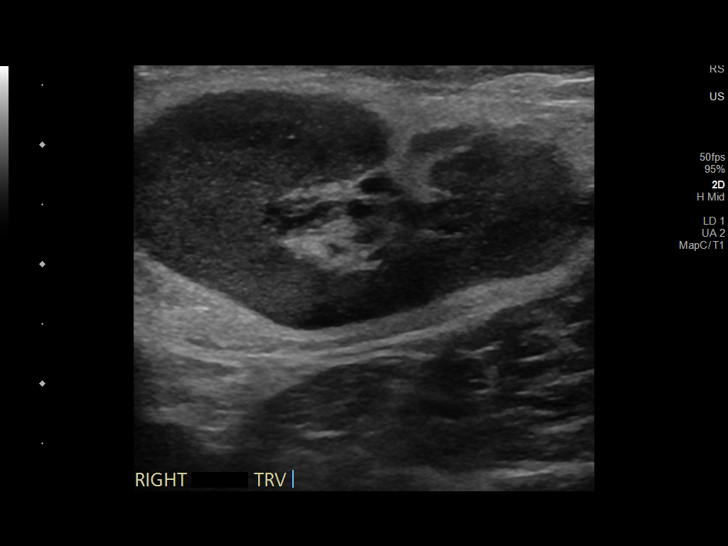
[im 2/16]
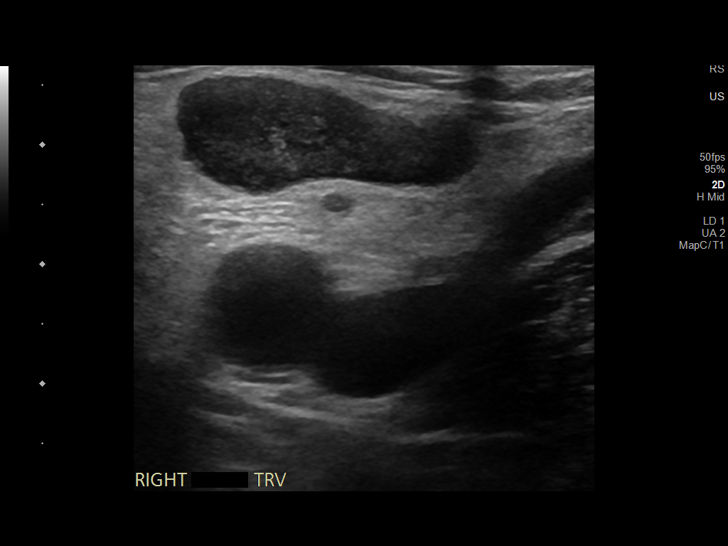
[im 4/16]
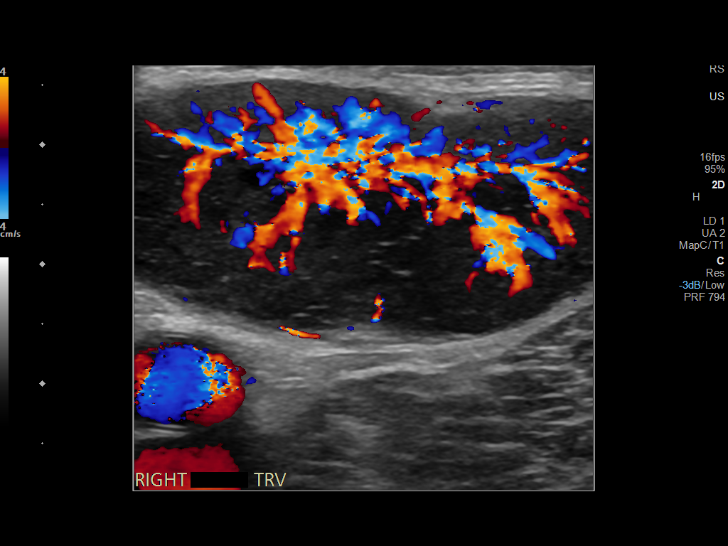
[im 5/16]
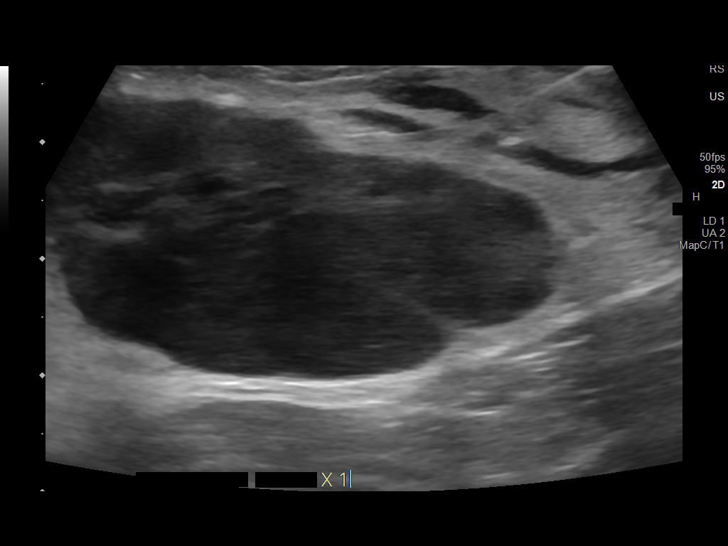
[im 6/16]
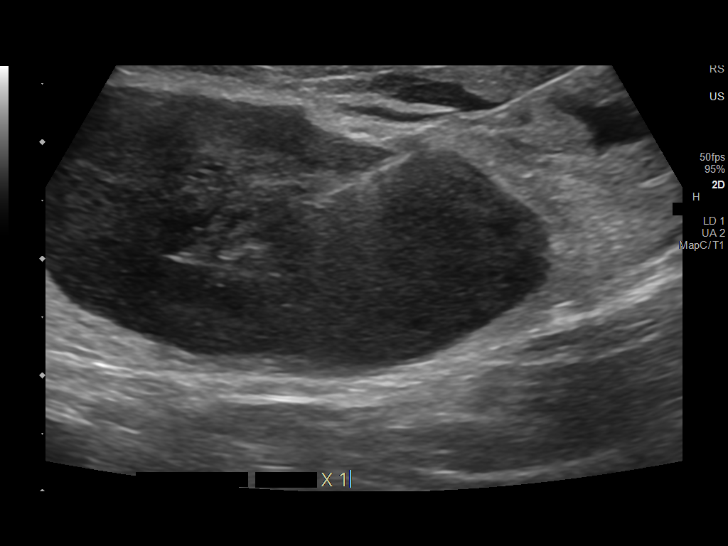
[im 7/16]
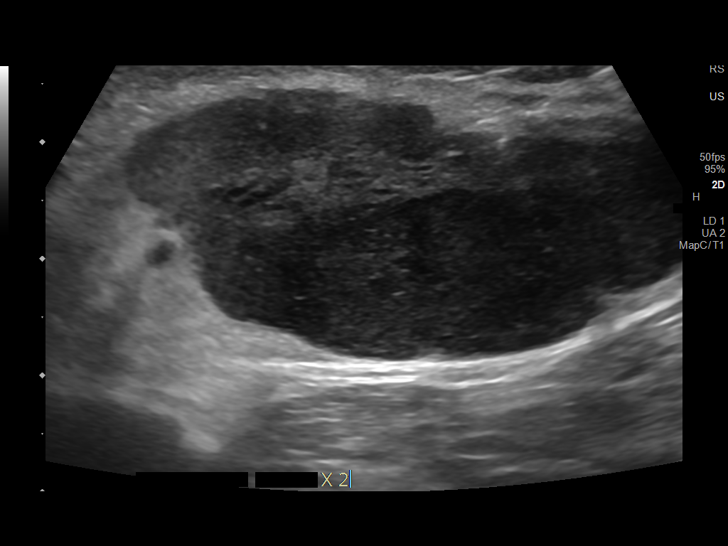
[im 9/16]
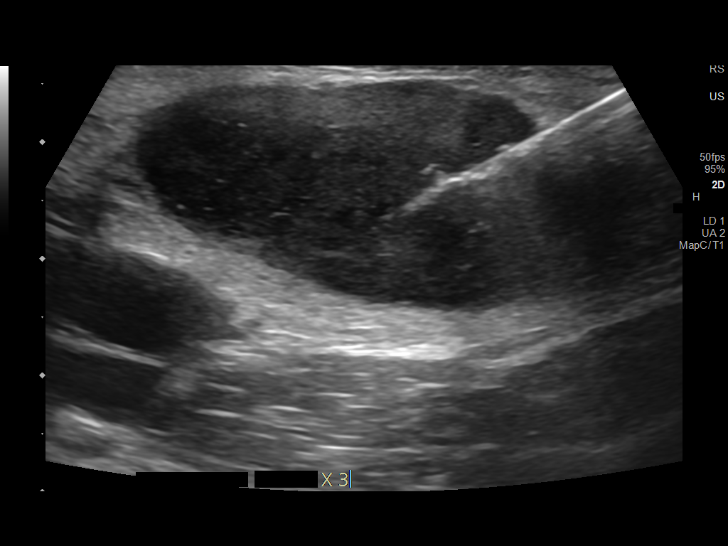
[im 10/16]
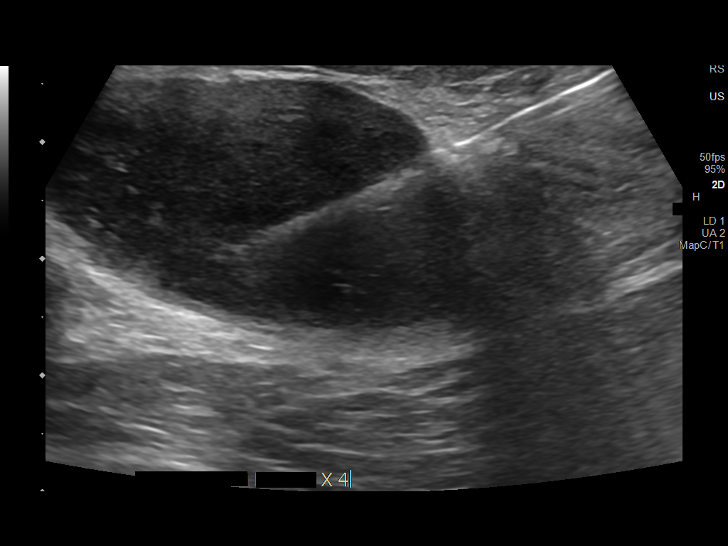
[im 11/16]
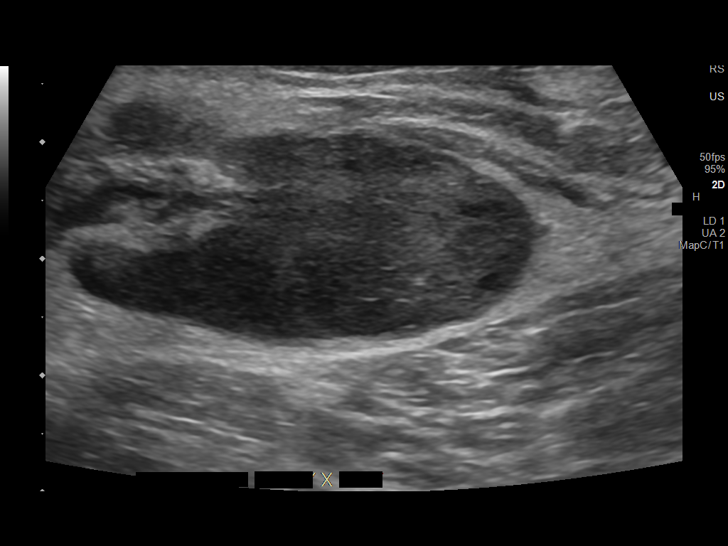
[im 12/16]
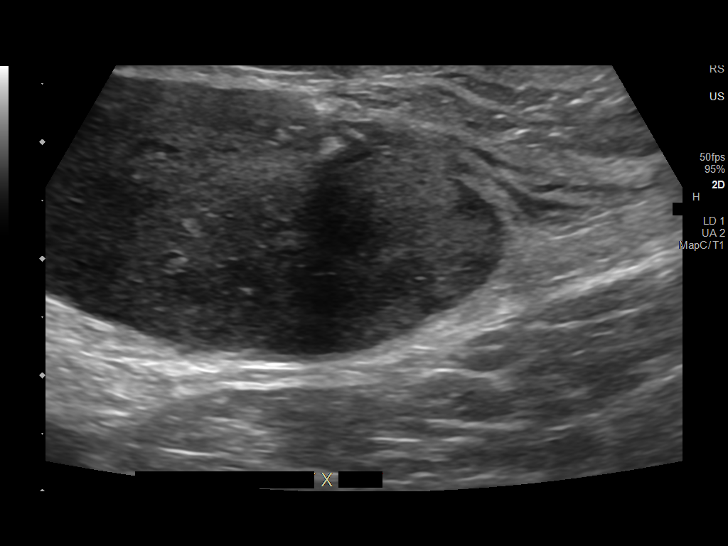
[im 13/16]
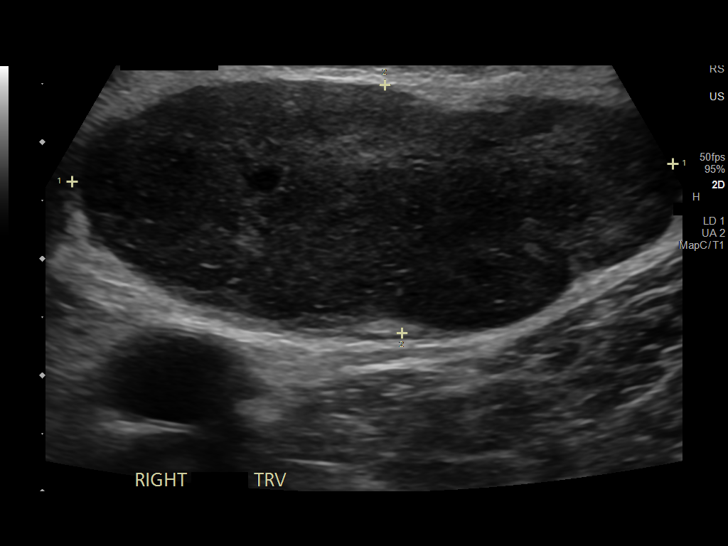
[im 15/16]
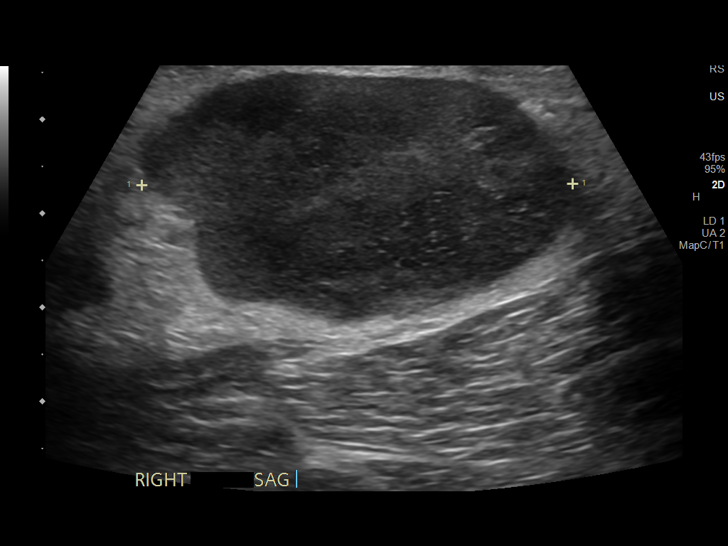
[im 16/16]
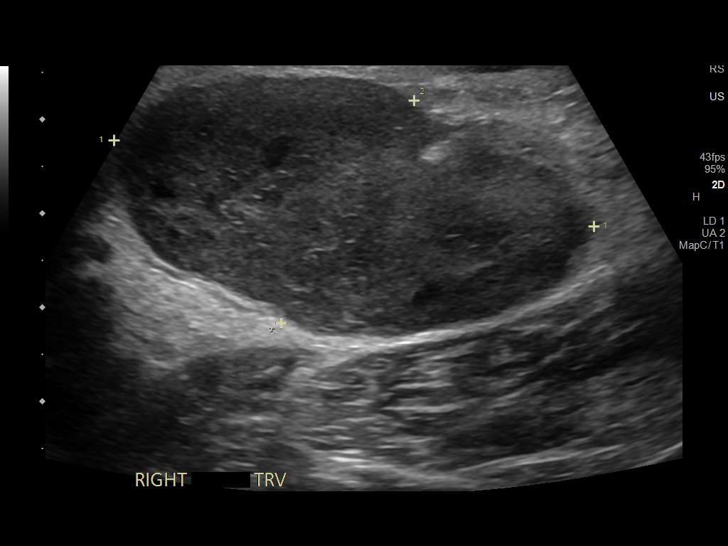

[13 of 16 positions shown; findings below may reference images not displayed]

EXAM:
ULTRASOUND GUIDED CORE BIOPSY OF RIGHT INGUINAL LYMPH NODE

MEDICATIONS:
None.

ANESTHESIA/SEDATION:
Fentanyl 100 mcg IV; Versed 2.0 mg IV

Moderate Sedation Time:  15 minutes.

The patient was continuously monitored during the procedure by the
interventional radiology nurse under my direct supervision.

PROCEDURE:
The procedure, risks, benefits, and alternatives were explained to
the patient. Questions regarding the procedure were encouraged and
answered. The patient understands and consents to the procedure. A
time-out was performed prior to initiating the procedure.

Ultrasound was used to localize right inguinal lymph nodes. The
right groin region was prepped with chlorhexidine in a sterile
fashion, and a sterile drape was applied covering the operative
field. A sterile gown and sterile gloves were used for the
procedure. Local anesthesia was provided with 1% Lidocaine.

Under ultrasound guidance, a 16 gauge core biopsy device was
utilized in obtaining 4 separate core biopsy samples through
different portions of a right inguinal lymph node. Core biopsy
samples were submitted in saline. Additional ultrasound was
performed.

COMPLICATIONS:
None immediate.
FINDINGS: The largest right inguinal lymph node measures approximately 5.2 x
2.8 x 4.6 cm. Solid tissue was obtained from this lymph node.
IMPRESSION: Ultrasound-guided core biopsy performed of the largest right
inguinal lymph node measuring 5 cm in greatest diameter.
